# Patient Record
Sex: Female | Born: 1970 | Race: White | Hispanic: No | Marital: Married | Smoking: Current every day smoker
Health system: Southern US, Community
[De-identification: ages and names within clinical notes are randomized; demographics above are authoritative.]

## PROBLEM LIST (undated history)

## (undated) DIAGNOSIS — F419 Anxiety disorder, unspecified: Secondary | ICD-10-CM

## (undated) DIAGNOSIS — I10 Essential (primary) hypertension: Secondary | ICD-10-CM

## (undated) HISTORY — DX: Essential (primary) hypertension: I10

## (undated) HISTORY — PX: ABDOMINAL HYSTERECTOMY: SHX81

## (undated) HISTORY — DX: Anxiety disorder, unspecified: F41.9

---

## 2007-01-03 ENCOUNTER — Ambulatory Visit: Payer: Self-pay | Admitting: Internal Medicine

## 2007-10-21 ENCOUNTER — Emergency Department: Payer: Self-pay | Admitting: Emergency Medicine

## 2008-03-03 ENCOUNTER — Ambulatory Visit: Payer: Self-pay | Admitting: Unknown Physician Specialty

## 2008-03-06 ENCOUNTER — Ambulatory Visit: Payer: Self-pay | Admitting: Unknown Physician Specialty

## 2008-11-03 ENCOUNTER — Emergency Department: Payer: Self-pay | Admitting: Emergency Medicine

## 2009-02-09 ENCOUNTER — Emergency Department: Payer: Self-pay | Admitting: Emergency Medicine

## 2010-01-18 ENCOUNTER — Inpatient Hospital Stay: Payer: Self-pay | Admitting: Unknown Physician Specialty

## 2010-01-22 ENCOUNTER — Ambulatory Visit: Payer: Self-pay | Admitting: Unknown Physician Specialty

## 2010-02-21 ENCOUNTER — Ambulatory Visit: Payer: Self-pay | Admitting: Unknown Physician Specialty

## 2010-03-24 ENCOUNTER — Ambulatory Visit: Payer: Self-pay | Admitting: Unknown Physician Specialty

## 2011-09-20 ENCOUNTER — Ambulatory Visit: Payer: Self-pay | Admitting: Internal Medicine

## 2012-05-11 ENCOUNTER — Ambulatory Visit: Payer: Self-pay | Admitting: Internal Medicine

## 2012-05-22 ENCOUNTER — Ambulatory Visit: Payer: Self-pay | Admitting: Internal Medicine

## 2013-04-04 ENCOUNTER — Ambulatory Visit: Payer: Self-pay | Admitting: Orthopedic Surgery

## 2013-07-18 ENCOUNTER — Ambulatory Visit: Payer: Self-pay | Admitting: Internal Medicine

## 2013-07-23 ENCOUNTER — Ambulatory Visit: Payer: Self-pay | Admitting: Internal Medicine

## 2013-12-04 ENCOUNTER — Ambulatory Visit: Payer: Self-pay | Admitting: Internal Medicine

## 2013-12-12 ENCOUNTER — Ambulatory Visit: Payer: Self-pay | Admitting: Anesthesiology

## 2014-04-16 ENCOUNTER — Other Ambulatory Visit: Payer: Self-pay | Admitting: Diagnostic Radiology

## 2014-04-16 LAB — CREATININE, SERUM
CREATININE: 0.38 mg/dL — AB (ref 0.60–1.30)
EGFR (Non-African Amer.): >60

## 2014-04-17 ENCOUNTER — Ambulatory Visit: Payer: Self-pay | Admitting: Internal Medicine

## 2014-05-22 ENCOUNTER — Ambulatory Visit: Payer: Self-pay | Admitting: Vascular Surgery

## 2015-07-16 ENCOUNTER — Other Ambulatory Visit: Payer: Self-pay | Admitting: Internal Medicine

## 2015-07-16 DIAGNOSIS — Z1231 Encounter for screening mammogram for malignant neoplasm of breast: Secondary | ICD-10-CM

## 2015-07-24 ENCOUNTER — Ambulatory Visit
Admission: RE | Admit: 2015-07-24 | Discharge: 2015-07-24 | Disposition: A | Discharge status: Home / Self Care | Payer: 59 | Source: Ambulatory Visit | Attending: Internal Medicine | Admitting: Internal Medicine

## 2015-07-24 DIAGNOSIS — Z1231 Encounter for screening mammogram for malignant neoplasm of breast: Secondary | ICD-10-CM

## 2015-12-16 ENCOUNTER — Ambulatory Visit: Payer: 59 | Admitting: Anesthesiology

## 2015-12-31 ENCOUNTER — Ambulatory Visit: Payer: 59 | Admitting: Anesthesiology

## 2016-01-05 ENCOUNTER — Ambulatory Visit: Payer: 59 | Attending: Anesthesiology | Admitting: Anesthesiology

## 2016-01-05 ENCOUNTER — Encounter: Payer: Self-pay | Admitting: Anesthesiology

## 2016-01-05 ENCOUNTER — Encounter (INDEPENDENT_AMBULATORY_CARE_PROVIDER_SITE_OTHER): Payer: Self-pay

## 2016-01-05 ENCOUNTER — Other Ambulatory Visit: Payer: Self-pay | Admitting: Internal Medicine

## 2016-01-05 VITALS — BP 154/90 | HR 82 | Temp 98.4°F | Resp 16 | Ht 62.0 in | Wt 118.0 lb

## 2016-01-05 DIAGNOSIS — M542 Cervicalgia: Secondary | ICD-10-CM | POA: Diagnosis present

## 2016-01-05 DIAGNOSIS — M5030 Other cervical disc degeneration, unspecified cervical region: Secondary | ICD-10-CM | POA: Diagnosis not present

## 2016-01-05 DIAGNOSIS — Z79899 Other long term (current) drug therapy: Secondary | ICD-10-CM | POA: Insufficient documentation

## 2016-01-05 DIAGNOSIS — F419 Anxiety disorder, unspecified: Secondary | ICD-10-CM | POA: Insufficient documentation

## 2016-01-05 DIAGNOSIS — Z90710 Acquired absence of both cervix and uterus: Secondary | ICD-10-CM | POA: Insufficient documentation

## 2016-01-05 DIAGNOSIS — M47812 Spondylosis without myelopathy or radiculopathy, cervical region: Secondary | ICD-10-CM

## 2016-01-05 DIAGNOSIS — I10 Essential (primary) hypertension: Secondary | ICD-10-CM | POA: Insufficient documentation

## 2016-01-05 DIAGNOSIS — M1388 Other specified arthritis, other site: Secondary | ICD-10-CM | POA: Diagnosis not present

## 2016-01-05 DIAGNOSIS — M545 Low back pain: Secondary | ICD-10-CM | POA: Insufficient documentation

## 2016-01-05 DIAGNOSIS — G8929 Other chronic pain: Secondary | ICD-10-CM

## 2016-01-05 DIAGNOSIS — F17210 Nicotine dependence, cigarettes, uncomplicated: Secondary | ICD-10-CM | POA: Insufficient documentation

## 2016-01-05 DIAGNOSIS — M5412 Radiculopathy, cervical region: Secondary | ICD-10-CM | POA: Diagnosis not present

## 2016-01-05 DIAGNOSIS — M4692 Unspecified inflammatory spondylopathy, cervical region: Secondary | ICD-10-CM | POA: Diagnosis not present

## 2016-01-05 NOTE — Progress Notes (Signed)
Safety precautions to be maintained throughout the outpatient stay will include: orient to surroundings, keep bed in low position, maintain call bell within reach at all times, provide assistance with transfer out of bed and ambulation.  

## 2016-01-06 ENCOUNTER — Encounter: Payer: Self-pay | Admitting: Anesthesiology

## 2016-01-06 NOTE — Progress Notes (Signed)
Subjective:  Patient ID: Holly Wilkinson, female    DOB: Jan 21, 1971  Age: 45 y.o. MRN: [id number]  CC: Neck Pain   HPI Holly Okaracy Schloss presents for a new patient evaluation. She is a pleasant 45 year old white female with a long-standing history of neck, thoracic and low back pain. She describes pain that started 3 years ago when she attempted a back flip and landed on her head. She now has neck pain that has been unrelenting for 3 years. She describes a maximum VAS score of 9 with an average of 8 gradually gotten worse. She has been seen by Dr. Daisy LazarHandi with previous neurosurgical evaluation and at that time was considered a nonsurgical candidate. She's had previous peripheral nerve conduction studies 1 year ago and has been through physical therapy with tens unit application. She was seen at the [facility]  pain clinic and received a series of cervical epidural steroids and lumbar epidural steroids without significant improvement. These were of minimal benefit and she continues to have severe neck pain with radiation into both trapezius muscles. She describes a bicep weakness right side greater than left and the pain is worse with motion and better with stretching medication management and TENS unit application. The pain is constant getting longer and associated with some tingling in her hands and neck. She has associated numbness and pain that wakes her up at night occasionally numbness radiating into both feet. She has had a previous MRI and is scheduled for another MRI and do for a repeat evaluation for neurosurgical intervention. In the past she reports that she has taken oxycodone and this is given her significant improvement in her pain and most of the conservative measures fail.  History [name] has a past medical history of Anxiety and Hypertension.   She has past surgical history that includes Abdominal hysterectomy and Cesarean section.   Her family history includes Arthritis in her mother; COPD in her  maternal uncle; Cancer in her mother; Diabetes in her mother; Hypertension in her mother.She reports that she has been smoking Cigarettes.  She has been smoking about 0.50 packs per day. She does not have any smokeless tobacco history on file. She reports that she does not drink alcohol or use illicit drugs.   ---------------------------------------------------------------------------------------------------------------------- Past Medical History  Diagnosis Date  . Anxiety   . Hypertension     Past Surgical History  Procedure Laterality Date  . Abdominal hysterectomy    . Cesarean section      Family History  Problem Relation Age of Onset  . Arthritis Mother   . Cancer Mother   . Diabetes Mother   . Hypertension Mother   . COPD Maternal Uncle     Social History  Substance Use Topics  . Smoking status: Current Every Day Smoker -- 0.50 packs/day    Types: Cigarettes  . Smokeless tobacco: Not on file  . Alcohol Use: No    ---------------------------------------------------------------------------------------------------------------------- Social History   Social History  . Marital Status: Divorced    Spouse Name: N/A  . Number of Children: N/A  . Years of Education: N/A   Social History Main Topics  . Smoking status: Current Every Day Smoker -- 0.50 packs/day    Types: Cigarettes  . Smokeless tobacco: None  . Alcohol Use: No  . Drug Use: No  . Sexual Activity: Not Asked   Other Topics Concern  . None   Social History Narrative      ----------------------------------------------------------------------------------------------------------------------  ROS Review of Systems  Cardiac: High  blood pressure Pulmonary: Smoker Neurologic: Anxiety Psychologic: Negative other than anxiety GI: Negative  Objective:  BP 154/90 mmHg  Pulse 82  Temp(Src) 98.4 F (36.9 C) (Oral)  Resp 16  Ht  (1.575 m)  Wt 118 lb (53.524 kg)  BMI 21.58 kg/m2  SpO2  99%  Physical Exam  Patient is a alert oriented and cooperative Good historian Extraocular muscles intact Heart is regular rate and rhythm Lungs are clear to also dictation Inspection of the neck reveals limited range of motion at the atlantooccipital joint. She has tenderness in the bilateral trapezius muscles and slightly weak with flexion of the right biceps as compared to left Grip strength is intact bilaterally but appears slightly diminished right versus left sensation is grossly intact    Assessment & Plan:   Calea was seen today for neck pain.  Diagnoses and all orders for this visit:  Cervicalgia -     ToxASSURE Select 13 (MW), Urine  DDD (degenerative disc disease), cervical  Facet arthritis of cervical region [facility])  Cervical radiculitis     ----------------------------------------------------------------------------------------------------------------------  Problem List Items Addressed This Visit    None    Visit Diagnoses    Cervicalgia    -  Primary    Relevant Orders    ToxASSURE Select 13 (MW), Urine    DDD (degenerative disc disease), cervical        Relevant Medications    oxyCODONE-acetaminophen (PERCOCET) 10-325 MG tablet    Facet arthritis of cervical region (HCC)        Relevant Medications    oxyCODONE-acetaminophen (PERCOCET) 10-325 MG tablet    Cervical radiculitis        Relevant Medications    clonazePAM (KLONOPIN) 1 MG tablet    escitalopram (LEXAPRO) 10 MG tablet       ----------------------------------------------------------------------------------------------------------------------  1. Cervicalgia  - ToxASSURE Select 13 (MW), Urine  2. DDD (degenerative disc disease), cervical We will withhold any further invasive procedures such as cervical epidural steroid administration and she is not responding favorably to these in the past  3. Facet arthritis of cervical region [facility]) She could be a candidate for cervical facet  injections  4. Cervical radiculitis She is due for repeat MRI and is scheduled for a repeat neurosurgical evaluation. We will have her return to clinic in 2-3 weeks for reevaluation and possible initiation of opioid management as discussed with her today and contingent upon maintenance of the clinic policy    ----------------------------------------------------------------------------------------------------------------------  I am having Ms. Insco maintain her amLODipine, clonazePAM, escitalopram, losartan, and oxyCODONE-acetaminophen.   Meds ordered this encounter  Medications  . amLODipine (NORVASC) 2.5 MG tablet    Sig: Take by mouth.  . clonazePAM (KLONOPIN) 1 MG tablet    Sig:   . escitalopram (LEXAPRO) 10 MG tablet    Sig: Take by mouth.  . losartan (COZAAR) 25 MG tablet    Sig: Take by mouth.  . oxyCODONE-acetaminophen (PERCOCET) 10-325 MG tablet    Sig: Take by mouth.       Follow-up: Return in about 2 weeks (around 01/19/2016) for evaluation, med refill.    Yevette Edwards, MD

## 2016-01-08 ENCOUNTER — Ambulatory Visit
Admission: RE | Admit: 2016-01-08 | Discharge: 2016-01-08 | Disposition: A | Discharge status: Home / Self Care | Payer: Commercial Managed Care - HMO | Source: Ambulatory Visit | Attending: Internal Medicine | Admitting: Internal Medicine

## 2016-01-08 DIAGNOSIS — M50221 Other cervical disc displacement at C4-C5 level: Secondary | ICD-10-CM | POA: Diagnosis not present

## 2016-01-08 DIAGNOSIS — M50222 Other cervical disc displacement at C5-C6 level: Secondary | ICD-10-CM | POA: Diagnosis not present

## 2016-01-08 DIAGNOSIS — M542 Cervicalgia: Secondary | ICD-10-CM | POA: Insufficient documentation

## 2016-01-08 DIAGNOSIS — G8929 Other chronic pain: Secondary | ICD-10-CM | POA: Insufficient documentation

## 2016-01-09 LAB — TOXASSURE SELECT 13 (MW), URINE: PDF: 0

## 2016-01-26 ENCOUNTER — Ambulatory Visit: Payer: 59 | Attending: Anesthesiology | Admitting: Anesthesiology

## 2016-01-26 ENCOUNTER — Encounter: Payer: Self-pay | Admitting: Anesthesiology

## 2016-01-26 VITALS — BP 147/87 | HR 100 | Temp 97.1°F | Resp 16 | Ht 62.0 in | Wt 118.0 lb

## 2016-01-26 DIAGNOSIS — M542 Cervicalgia: Secondary | ICD-10-CM | POA: Diagnosis not present

## 2016-01-26 DIAGNOSIS — M5412 Radiculopathy, cervical region: Secondary | ICD-10-CM

## 2016-01-26 DIAGNOSIS — M4692 Unspecified inflammatory spondylopathy, cervical region: Secondary | ICD-10-CM | POA: Diagnosis not present

## 2016-01-26 DIAGNOSIS — M5030 Other cervical disc degeneration, unspecified cervical region: Secondary | ICD-10-CM | POA: Diagnosis not present

## 2016-01-26 DIAGNOSIS — M47812 Spondylosis without myelopathy or radiculopathy, cervical region: Secondary | ICD-10-CM

## 2016-01-26 DIAGNOSIS — M5010 Cervical disc disorder with radiculopathy, unspecified cervical region: Secondary | ICD-10-CM | POA: Diagnosis not present

## 2016-01-26 MED ORDER — OXYCODONE-ACETAMINOPHEN 10-325 MG PO TABS: 1.0000 | ORAL_TABLET | Freq: Three times a day (TID) | ORAL | Status: DC | PRN

## 2016-01-26 NOTE — Progress Notes (Signed)
Safety precautions to be maintained throughout the outpatient stay will include: orient to surroundings, keep bed in low position, maintain call bell within reach at all times, provide assistance with transfer out of bed and ambulation.  

## 2016-02-22 ENCOUNTER — Encounter: Payer: Self-pay | Admitting: Anesthesiology

## 2016-02-22 ENCOUNTER — Ambulatory Visit: Payer: 59 | Attending: Anesthesiology | Admitting: Anesthesiology

## 2016-02-22 VITALS — BP 130/84 | HR 79 | Temp 98.3°F | Resp 16 | Ht 62.0 in | Wt 120.0 lb

## 2016-02-22 DIAGNOSIS — M4692 Unspecified inflammatory spondylopathy, cervical region: Secondary | ICD-10-CM

## 2016-02-22 DIAGNOSIS — M5412 Radiculopathy, cervical region: Secondary | ICD-10-CM

## 2016-02-22 DIAGNOSIS — R200 Anesthesia of skin: Secondary | ICD-10-CM | POA: Insufficient documentation

## 2016-02-22 DIAGNOSIS — F17210 Nicotine dependence, cigarettes, uncomplicated: Secondary | ICD-10-CM | POA: Insufficient documentation

## 2016-02-22 DIAGNOSIS — F419 Anxiety disorder, unspecified: Secondary | ICD-10-CM | POA: Insufficient documentation

## 2016-02-22 DIAGNOSIS — M545 Low back pain: Secondary | ICD-10-CM | POA: Insufficient documentation

## 2016-02-22 DIAGNOSIS — M542 Cervicalgia: Secondary | ICD-10-CM | POA: Insufficient documentation

## 2016-02-22 DIAGNOSIS — I10 Essential (primary) hypertension: Secondary | ICD-10-CM | POA: Insufficient documentation

## 2016-02-22 DIAGNOSIS — M47812 Spondylosis without myelopathy or radiculopathy, cervical region: Secondary | ICD-10-CM

## 2016-02-22 DIAGNOSIS — M5010 Cervical disc disorder with radiculopathy, unspecified cervical region: Secondary | ICD-10-CM | POA: Insufficient documentation

## 2016-02-22 DIAGNOSIS — Z90710 Acquired absence of both cervix and uterus: Secondary | ICD-10-CM | POA: Diagnosis not present

## 2016-02-22 DIAGNOSIS — M5030 Other cervical disc degeneration, unspecified cervical region: Secondary | ICD-10-CM | POA: Diagnosis not present

## 2016-02-22 MED ORDER — OXYCODONE-ACETAMINOPHEN 10-325 MG PO TABS: 1.0000 | ORAL_TABLET | Freq: Three times a day (TID) | ORAL | Status: DC | PRN

## 2016-02-22 NOTE — Progress Notes (Signed)
Patient here for medication refill.  Patient has a scheduled surgery to replace 1 cervical disc on 02/23/16. Safety precautions to be maintained throughout the outpatient stay will include: orient to surroundings, keep bed in low position, maintain call bell within reach at all times, provide assistance with transfer out of bed and ambulation.

## 2016-02-23 HISTORY — PX: NECK SURGERY: SHX720

## 2016-02-23 NOTE — Progress Notes (Signed)
Subjective:  Patient ID: Holly Wilkinson, female    DOB: 1971-04-13  Age: 45 y.o. MRN: [id number]  CC: Neck Pain   HPI Holly Wilkinson presents forReevaluation. She is a pleasant 45 year old white female with a long-standing history of neck, thoracic and low back pain. She describes pain that started 3 years ago when she attempted a back flip and landed on her head. She now has neck pain that has been unrelenting for 3 years. She describes a maximum VAS score of 9 with an average of 8 gradually gotten worse. She has been seen by Dr. Daisy LazarHandi with previous neurosurgical evaluation and at that time was considered a nonsurgical candidate. She's had previous peripheral nerve conduction studies 1 year ago and has been through physical therapy with tens unit application. She was seen at the [facility]  pain clinic and received a series of cervical epidural steroids and lumbar epidural steroids without significant improvement. These were of minimal benefit and she continues to have severe neck pain with radiation into both trapezius muscles. She describes a bicep weakness right side greater than left and the pain is worse with motion and better with stretching medication management and TENS unit application. The pain is constant getting longer and associated with some tingling in her hands and neck. She has associated numbness and pain that wakes her up at night occasionally numbness radiating into both feet. She has had a previous MRI and is scheduled for another MRI and do for a repeat evaluation for neurosurgical intervention. In the past she reports that she has taken oxycodone and this is given her significant improvement in her pain and most of the conservative measures fail.  She presents today stating that she is due for a surgical intervention with Dr. Danielle DessElsner tomorrow. He is planning on a 2 level cervical fusion.  History Holly Wilkinson has a past medical history of Anxiety and Hypertension.   She has past surgical  history that includes Abdominal hysterectomy and Cesarean section.   Her family history includes Arthritis in her mother; COPD in her maternal uncle; Cancer in her mother; Diabetes in her mother; Hypertension in her mother.She reports that she has been smoking Cigarettes.  She has been smoking about 0.50 packs per day. She does not have any smokeless tobacco history on file. She reports that she does not drink alcohol or use illicit drugs.   ---------------------------------------------------------------------------------------------------------------------- Past Medical History  Diagnosis Date  . Anxiety   . Hypertension     Past Surgical History  Procedure Laterality Date  . Abdominal hysterectomy    . Cesarean section      Family History  Problem Relation Age of Onset  . Arthritis Mother   . Cancer Mother   . Diabetes Mother   . Hypertension Mother   . COPD Maternal Uncle     Social History  Substance Use Topics  . Smoking status: Current Every Day Smoker -- 0.50 packs/day    Types: Cigarettes  . Smokeless tobacco: Not on file  . Alcohol Use: No    ---------------------------------------------------------------------------------------------------------------------- Social History   Social History  . Marital Status: Married    Spouse Name: N/A  . Number of Children: N/A  . Years of Education: N/A   Social History Main Topics  . Smoking status: Current Every Day Smoker -- 0.50 packs/day    Types: Cigarettes  . Smokeless tobacco: None  . Alcohol Use: No  . Drug Use: No  . Sexual Activity: Not Asked   Other Topics Concern  .  None   Social History Narrative      ----------------------------------------------------------------------------------------------------------------------  ROS Review of Systems  Cardiac: High blood pressure Pulmonary: Smoker Neurologic: Anxiety Psychologic: Negative other than anxiety GI: Negative  Objective:  BP 130/84 mmHg   Pulse 79  Temp(Src) 98.3 F (36.8 C) (Oral)  Resp 16  Ht  (1.575 m)  Wt 120 lb (54.432 kg)  BMI 21.94 kg/m2  SpO2 98%  Physical Exam  Patient is a alert oriented and cooperative Good historian Extraocular muscles intact Heart is regular rate and rhythm Lungs are clear to also dictation Inspection of the neck reveals limited range of motion at the atlantooccipital joint. She has tenderness in the bilateral trapezius muscles and slightly weak with flexion of the right biceps as compared to left Grip strength is intact bilaterally but appears slightly diminished right versus left sensation is grossly intact    Assessment & Plan:   Holly Wilkinson was seen today for neck pain.  Diagnoses and all orders for this visit:  DDD (degenerative disc disease), cervical  Facet arthritis of cervical region [facility])  Cervical radiculitis  Other orders -     Discontinue: oxyCODONE-acetaminophen (PERCOCET) 10-325 MG tablet; Take 1 tablet by mouth every 8 (eight) hours as needed for pain. -     oxyCODONE-acetaminophen (PERCOCET) 10-325 MG tablet; Take 1 tablet by mouth every 8 (eight) hours as needed for pain.     ----------------------------------------------------------------------------------------------------------------------  Problem List Items Addressed This Visit    None    Visit Diagnoses    DDD (degenerative disc disease), cervical    -  Primary    Relevant Medications    oxyCODONE-acetaminophen (PERCOCET) 10-325 MG tablet    Facet arthritis of cervical region [facility])        Relevant Medications    oxyCODONE-acetaminophen (PERCOCET) 10-325 MG tablet    Cervical radiculitis           ----------------------------------------------------------------------------------------------------------------------  1. Cervicalgia  - ToxASSURE Select 13 (MW), UrineAnd appropriate  2. DDD (degenerative disc disease), cervical We will withhold any further invasive procedures such as  cervical epidural steroid administration and she is not responding favorably to these in the past with her plan on cervical fusion tomorrow with Dr. Danielle Dess  3. Facet arthritis of cervical region [facility]) She could be a candidate for cervical facet injections  4. Cervical radiculitis She does return to clinic in 1 month for refill of her medications and her initial fill today.   ----------------------------------------------------------------------------------------------------------------------  I am having Holly Wilkinson maintain her amLODipine, clonazePAM, escitalopram, losartan, albuterol, amLODipine, and oxyCODONE-acetaminophen.   Meds ordered this encounter  Medications  . DISCONTD: oxyCODONE-acetaminophen (PERCOCET) 10-325 MG tablet    Sig: Take 1 tablet by mouth every 8 (eight) hours as needed for pain.    Dispense:  90 tablet    Refill:  0    Do not fill until [id number]  . oxyCODONE-acetaminophen (PERCOCET) 10-325 MG tablet    Sig: Take 1 tablet by mouth every 8 (eight) hours as needed for pain.    Dispense:  90 tablet    Refill:  0       Follow-up: Return in about 1 month (around 03/24/2016) for evaluation, med refill.    Yevette Edwards, MD

## 2016-03-25 ENCOUNTER — Ambulatory Visit: Payer: 59 | Attending: Anesthesiology | Admitting: Anesthesiology

## 2016-03-25 ENCOUNTER — Encounter: Payer: Self-pay | Admitting: Anesthesiology

## 2016-03-25 VITALS — BP 151/94 | HR 106 | Temp 98.3°F | Resp 16 | Ht 62.0 in | Wt 119.0 lb

## 2016-03-25 DIAGNOSIS — M5010 Cervical disc disorder with radiculopathy, unspecified cervical region: Secondary | ICD-10-CM | POA: Insufficient documentation

## 2016-03-25 DIAGNOSIS — M5030 Other cervical disc degeneration, unspecified cervical region: Secondary | ICD-10-CM | POA: Diagnosis not present

## 2016-03-25 DIAGNOSIS — M4692 Unspecified inflammatory spondylopathy, cervical region: Secondary | ICD-10-CM

## 2016-03-25 DIAGNOSIS — I10 Essential (primary) hypertension: Secondary | ICD-10-CM | POA: Insufficient documentation

## 2016-03-25 DIAGNOSIS — M542 Cervicalgia: Secondary | ICD-10-CM | POA: Insufficient documentation

## 2016-03-25 DIAGNOSIS — M47812 Spondylosis without myelopathy or radiculopathy, cervical region: Secondary | ICD-10-CM

## 2016-03-25 DIAGNOSIS — F419 Anxiety disorder, unspecified: Secondary | ICD-10-CM | POA: Diagnosis not present

## 2016-03-25 DIAGNOSIS — M5412 Radiculopathy, cervical region: Secondary | ICD-10-CM | POA: Diagnosis not present

## 2016-03-25 DIAGNOSIS — F17210 Nicotine dependence, cigarettes, uncomplicated: Secondary | ICD-10-CM | POA: Insufficient documentation

## 2016-03-25 DIAGNOSIS — Z90710 Acquired absence of both cervix and uterus: Secondary | ICD-10-CM | POA: Insufficient documentation

## 2016-03-25 MED ORDER — METHOCARBAMOL 500 MG PO TABS: 500.0000 mg | ORAL_TABLET | Freq: Four times a day (QID) | ORAL | Status: DC | PRN

## 2016-03-25 MED ORDER — OXYCODONE-ACETAMINOPHEN 10-325 MG PO TABS: 1.0000 | ORAL_TABLET | Freq: Three times a day (TID) | ORAL | Status: DC | PRN

## 2016-03-25 NOTE — Progress Notes (Signed)
Patient here for medication management.  Is s/p neck surgery having had 2 cervical disc replaced.  Patient was taking some medicines that were prescribed from surgeon and found that the Robaxin was helping her. Have included in her medication list. Safety precautions to be maintained throughout the outpatient stay will include: orient to surroundings, keep bed in low position, maintain call bell within reach at all times, provide assistance with transfer out of bed and ambulation.

## 2016-03-25 NOTE — Progress Notes (Signed)
Subjective:  Patient ID: Holly Wilkinson, female    DOB: 1971/01/13  Age: 45 y.o. MRN: [id number]  CC: Neck Pain  No procedure   HPI Holly Wilkinson presents for reevaluation today. She was last seen 1 month ago. She is now approximately one month following a 2 level cervical fusion and the radicular pain she was experiencing has resolved. Her strength in the upper extremities is doing well. She is having some bilateral paraspinous cervical pain and upper shoulder pain periodically. She is taking her medications as prescribed and doing well with these. Otherwise based on her narcotic assessment sheet she's doing well with the regimen and getting good relief with her medication management.  History Holly Wilkinson has a past medical history of Anxiety and Hypertension.   She has past surgical history that includes Abdominal hysterectomy; Cesarean section; and Neck surgery (Right, 02/23/16).   Her family history includes Arthritis in her mother; COPD in her maternal uncle; Cancer in her mother; Diabetes in her mother; Hypertension in her mother.She reports that she has been smoking Cigarettes.  She has been smoking about 0.50 packs per day. She does not have any smokeless tobacco history on file. She reports that she does not drink alcohol or use illicit drugs.   ---------------------------------------------------------------------------------------------------------------------- Past Medical History  Diagnosis Date  . Anxiety   . Hypertension     Past Surgical History  Procedure Laterality Date  . Abdominal hysterectomy    . Cesarean section    . Neck surgery Right 02/23/16    2 disc replaced.     Family History  Problem Relation Age of Onset  . Arthritis Mother   . Cancer Mother   . Diabetes Mother   . Hypertension Mother   . COPD Maternal Uncle     Social History  Substance Use Topics  . Smoking status: Current Every Day Smoker -- 0.50 packs/day    Types: Cigarettes  . Smokeless  tobacco: Not on file  . Alcohol Use: No    ---------------------------------------------------------------------------------------------------------------------- Social History   Social History  . Marital Status: Married    Spouse Name: N/A  . Number of Children: N/A  . Years of Education: N/A   Social History Main Topics  . Smoking status: Current Every Day Smoker -- 0.50 packs/day    Types: Cigarettes  . Smokeless tobacco: None  . Alcohol Use: No  . Drug Use: No  . Sexual Activity: Not Asked   Other Topics Concern  . None   Social History Narrative      ----------------------------------------------------------------------------------------------------------------------  ROS Review of Systems  Cardiac: High blood pressure Pulmonary: Smoker Neurologic: Anxiety Psychologic: Negative other than anxiety GI: Negative  Objective:  BP 151/94 mmHg  Pulse 106  Temp(Src) 98.3 F (36.8 C) (Oral)  Resp 16  Ht 5\' 2"  (1.575 m)  Wt 119 lb (53.978 kg)  BMI 21.76 kg/m2  SpO2 100%  Physical Exam  Patient is a alert oriented and cooperative Good historian Extraocular muscles intact Heart is regular rate and rhythm Lungs are clear to also dictation Inspection of the neck reveals mildly limited range of motion but good grip strength and bilateral upper flexion and extension appears to be appropriate the scar is well healed over the cervical region  Assessment & Plan:   Holly Wilkinson was seen today for neck pain.  Diagnoses and all orders for this visit:  Facet arthritis of cervical region [facility](HCC)  Cervical radiculitis  Cervicalgia  DDD (degenerative disc disease), cervical  Other orders -  oxyCODONE-acetaminophen (PERCOCET) 10-325 MG tablet; Take 1 tablet by mouth every 8 (eight) hours as needed for pain. -     methocarbamol (ROBAXIN) 500 MG tablet; Take 1 tablet (500 mg total) by mouth every 6 (six) hours as needed for muscle  spasms.     ----------------------------------------------------------------------------------------------------------------------  Problem List Items Addressed This Visit    None    Visit Diagnoses    Facet arthritis of cervical region [facility])    -  Primary    Relevant Medications    oxyCODONE-acetaminophen (PERCOCET) 10-325 MG tablet    methocarbamol (ROBAXIN) 500 MG tablet    Cervical radiculitis        Relevant Medications    methocarbamol (ROBAXIN) 500 MG tablet    Cervicalgia        DDD (degenerative disc disease), cervical        Relevant Medications    oxyCODONE-acetaminophen (PERCOCET) 10-325 MG tablet    methocarbamol (ROBAXIN) 500 MG tablet       ----------------------------------------------------------------------------------------------------------------------  1. Cervicalgia  - ToxASSURE Select 13 (MW), UrineAnd appropriate  2. DDD (degenerative disc disease), cervical We will withhold any further invasive procedures such as cervical epidural steroid administration and she is not responding favorably to these in the past with her plan on cervical fusion tomorrow with Dr. Danielle Dess  3. Facet arthritis of cervical region [facility]) She could be a candidate for cervical facet injections  4. Cervical radiculitis continue doing stretching strengthening exercises as per physical therapy and as prescribed by her neurosurgeon. Return to clinic in 1 month and continue current medication regimen   ----------------------------------------------------------------------------------------------------------------------  I have changed Holly Wilkinson's methocarbamol. I am also having her maintain her amLODipine, clonazePAM, escitalopram, losartan, albuterol, amLODipine, promethazine, and oxyCODONE-acetaminophen.   Meds ordered this encounter  Medications  . promethazine (PHENERGAN) 12.5 MG tablet    Sig: Take 12.5 mg by mouth every 6 (six) hours as needed for nausea or vomiting.  Marland Kitchen  DISCONTD: methocarbamol (ROBAXIN) 500 MG tablet    Sig: Take 500 mg by mouth every 6 (six) hours as needed for muscle spasms.  Marland Kitchen oxyCODONE-acetaminophen (PERCOCET) 10-325 MG tablet    Sig: Take 1 tablet by mouth every 8 (eight) hours as needed for pain.    Dispense:  90 tablet    Refill:  0  . methocarbamol (ROBAXIN) 500 MG tablet    Sig: Take 1 tablet (500 mg total) by mouth every 6 (six) hours as needed for muscle spasms.    Dispense:  120 tablet    Refill:  2       Follow-up: Return in about 1 month (around 04/24/2016) for evaluation, med refill.    Yevette Edwards, MD

## 2016-04-21 ENCOUNTER — Encounter: Payer: Self-pay | Admitting: Anesthesiology

## 2016-04-21 ENCOUNTER — Ambulatory Visit: Payer: 59 | Attending: Anesthesiology | Admitting: Anesthesiology

## 2016-04-21 VITALS — BP 123/90 | HR 104 | Resp 16 | Ht 62.0 in | Wt 120.0 lb

## 2016-04-21 DIAGNOSIS — F419 Anxiety disorder, unspecified: Secondary | ICD-10-CM | POA: Insufficient documentation

## 2016-04-21 DIAGNOSIS — M549 Dorsalgia, unspecified: Secondary | ICD-10-CM | POA: Diagnosis present

## 2016-04-21 DIAGNOSIS — I10 Essential (primary) hypertension: Secondary | ICD-10-CM | POA: Diagnosis not present

## 2016-04-21 DIAGNOSIS — M5412 Radiculopathy, cervical region: Secondary | ICD-10-CM

## 2016-04-21 DIAGNOSIS — M25519 Pain in unspecified shoulder: Secondary | ICD-10-CM | POA: Diagnosis present

## 2016-04-21 DIAGNOSIS — F17210 Nicotine dependence, cigarettes, uncomplicated: Secondary | ICD-10-CM | POA: Diagnosis not present

## 2016-04-21 DIAGNOSIS — M797 Fibromyalgia: Secondary | ICD-10-CM

## 2016-04-21 DIAGNOSIS — M7918 Myalgia, other site: Secondary | ICD-10-CM

## 2016-04-21 DIAGNOSIS — M542 Cervicalgia: Secondary | ICD-10-CM | POA: Insufficient documentation

## 2016-04-21 DIAGNOSIS — M5010 Cervical disc disorder with radiculopathy, unspecified cervical region: Secondary | ICD-10-CM | POA: Insufficient documentation

## 2016-04-21 MED ORDER — OXYCODONE-ACETAMINOPHEN 5-325 MG PO TABS: 1.0000 | ORAL_TABLET | Freq: Four times a day (QID) | ORAL | Status: DC | PRN

## 2016-04-21 NOTE — Progress Notes (Signed)
Subjective:  Patient ID: Holly Wilkinson, female    DOB: 1971/09/19  Age: 45 y.o. MRN: [id number]  CC: Shoulder Pain and Back Pain  No procedure   HPI Holly Okaracy Ruffins presents for reevaluation today.She was last seen in the beginning of June and continues to do well in regards to her recent ACD at 2 levels. She is taking her medications including Percocet 10 mg 3 times a day and this is well tolerated. She is having some burning and spasming in the right proximal trapezius region but her upper extremity strength and function has been well preserved. Furthermore she is having occasional low back pain centralized in the low back without radiation to the lower extremities. Generally stretching alleviates this pain and keeps it under reasonable control. No issues with bowel or bladder function are mentioned today.  History [name] has a past medical history of Anxiety and Hypertension.   She has past surgical history that includes Abdominal hysterectomy; Cesarean section; and Neck surgery (Right, 02/23/16).   Her family history includes Arthritis in her mother; COPD in her maternal uncle; Cancer in her mother; Diabetes in her mother; Hypertension in her mother.She reports that she has been smoking Cigarettes.  She has been smoking about 0.50 packs per day. She does not have any smokeless tobacco history on file. She reports that she does not drink alcohol or use illicit drugs.   ---------------------------------------------------------------------------------------------------------------------- Past Medical History  Diagnosis Date  . Anxiety   . Hypertension     Past Surgical History  Procedure Laterality Date  . Abdominal hysterectomy    . Cesarean section    . Neck surgery Right 02/23/16    2 disc replaced.     Family History  Problem Relation Age of Onset  . Arthritis Mother   . Cancer Mother   . Diabetes Mother   . Hypertension Mother   . COPD Maternal Uncle     Social History   Substance Use Topics  . Smoking status: Current Every Day Smoker -- 0.50 packs/day    Types: Cigarettes  . Smokeless tobacco: Not on file  . Alcohol Use: No    ---------------------------------------------------------------------------------------------------------------------- Social History   Social History  . Marital Status: Married    Spouse Name: N/A  . Number of Children: N/A  . Years of Education: N/A   Social History Main Topics  . Smoking status: Current Every Day Smoker -- 0.50 packs/day    Types: Cigarettes  . Smokeless tobacco: None  . Alcohol Use: No  . Drug Use: No  . Sexual Activity: Not Asked   Other Topics Concern  . None   Social History Narrative      ----------------------------------------------------------------------------------------------------------------------  ROS Review of Systems  Cardiac: High blood pressure Pulmonary: Smoker Neurologic: Anxiety Psychologic: Negative other than anxiety GI: Negative  Objective:  BP 123/90 mmHg  Pulse 104  Resp 16  Ht 5\' 2"  (1.575 m)  Wt 120 lb (54.432 kg)  BMI 21.94 kg/m2  SpO2 100%  Physical Exam  Patient is a alert oriented and cooperative Good historian Extraocular muscles intact Heart is regular rate and rhythm Lungs are clear to also dictation There is a well-healed trigger in the proximal right trapezius  Assessment & Plan:   [name] was seen today for shoulder pain and back pain.  Diagnoses and all orders for this visit:  Cervical radiculitis  Cervicalgia -     TRIGGER POINT INJECTION; Future  Myofacial muscle pain -     TRIGGER POINT INJECTION;  Future  Other orders -     oxyCODONE-acetaminophen (ROXICET) 5-325 MG tablet; Take 1 tablet by mouth every 6 (six) hours as needed for severe pain.     ----------------------------------------------------------------------------------------------------------------------  Problem List Items Addressed This Visit    None     Visit Diagnoses    Cervical radiculitis    -  Primary    Cervicalgia        Relevant Orders    TRIGGER POINT INJECTION    Myofacial muscle pain        Relevant Orders    TRIGGER POINT INJECTION       ----------------------------------------------------------------------------------------------------------------------  1. Cervicalgia  2. DDD (degenerative disc disease), cervical Continue follow-up with Dr. Danielle DessElsner 3. Facet arthritis of cervical region (HCC)  3. Cervical radiculitis continue doing stretching strengthening exercises as per physical therapy and as prescribed by her neurosurgeon. Return to clinic in 1 month and continue current medication regimen. We will wean her medications as noted with her to the 5 mg 4 times a day dosing with an effort to discontinue within the next few months. Continue with physical therapy exercises with trigger point injection at her next visit.   ----------------------------------------------------------------------------------------------------------------------  I have discontinued Ms. Musolf's oxyCODONE-acetaminophen. I am also having her start on oxyCODONE-acetaminophen. Additionally, I am having her maintain her amLODipine, clonazePAM, escitalopram, losartan, albuterol, amLODipine, promethazine, and methocarbamol.   Meds ordered this encounter  Medications  . oxyCODONE-acetaminophen (ROXICET) 5-325 MG tablet    Sig: Take 1 tablet by mouth every 6 (six) hours as needed for severe pain.    Dispense:  120 tablet    Refill:  0       Follow-up: Return in about 1 month (around 05/21/2016).    Yevette EdwardsJames G Lydie Stammen, MD

## 2016-04-21 NOTE — Patient Instructions (Signed)
Trigger Point Injection Trigger points are areas where you have muscle pain. A trigger point injection is a shot given in the trigger point to relieve that pain. A trigger point might feel like a knot in your muscle. It hurts to press on a trigger point. Sometimes the pain spreads out (radiates) to other parts of the body. For example, pressing on a trigger point in your shoulder might cause pain in your arm or neck. You might have one trigger point. Or, you might have more than one. People often have trigger points in their upper back and lower back. They also occur often in the neck and shoulders. Pain from a trigger point lasts for a long time. It can make it hard to keep moving. You might not be able to do the exercise or physical therapy that could help you deal with the pain. A trigger point injection may help. It does not work for everyone. But, it may relieve your pain for a few days or a few months. A trigger point injection does not cure long-lasting (chronic) pain. LET YOUR CAREGIVER KNOW ABOUT:  Any allergies (especially to latex, lidocaine, or steroids).  Blood-thinning medicines that you take. These drugs can lead to bleeding or bruising after an injection. They include:  Aspirin.  Ibuprofen.  Clopidogrel.  Warfarin.  Other medicines you take. This includes all vitamins, herbs, eyedrops, over-the-counter medicines, and creams.  Use of steroids.  Recent infections.  Past problems with numbing medicines.  Bleeding problems.  Surgeries you have had.  Other health problems. RISKS AND COMPLICATIONS A trigger point injection is a safe treatment. However, problems may develop, such as:  Minor side effects usually go away in 1 to 2 days. These may include:  Soreness.  Bruising.  Stiffness.  More serious problems are rare. But, they may include:  Bleeding under the skin (hematoma).  Skin infection.  Breaking off of the needle under your skin.  Lung  puncture.  The trigger point injection may not work for you. BEFORE THE PROCEDURE You may need to stop taking any medicine that thins your blood. This is to prevent bleeding and bruising. Usually these medicines are stopped several days before the injection. No other preparation is needed. PROCEDURE  A trigger point injection can be given in your caregiver's office or in a clinic. Each injection takes 2 minutes or less.  Your caregiver will feel for trigger points. The caregiver may use a marker to circle the area for the injection.  The skin over the trigger point will be washed with a germ-killing (antiseptic) solution.  The caregiver pinches the spot for the injection.  Then, a very thin needle is used for the shot. You may feel pain or a twitching feeling when the needle enters the trigger point.  A numbing solution may be injected into the trigger point. Sometimes a drug to keep down swelling, redness, and warmth (inflammation) is also injected.  Your caregiver moves the needle around the trigger zone until the tightness and twitching goes away.  After the injection, your caregiver may put gentle pressure over the injection site.  Then it is covered with a bandage. AFTER THE PROCEDURE  You can go right home after the injection.  The bandage can be taken off after a few hours.  You may feel sore and stiff for 1 to 2 days.  Go back to your regular activities slowly. Your caregiver may ask you to stretch your muscles. Do not do anything that takes   extra energy for a few days.  Follow your caregiver's instructions to manage and treat other pain.   This information is not intended to replace advice given to you by your health care provider. Make sure you discuss any questions you have with your health care provider.   Document Released: 09/29/2011 Document Revised: 02/04/2013 Document Reviewed: 09/29/2011 Else[employer] Interactive Patient Education 2016 [employer] GENERAL RISKS  AND COMPLICATIONS  What are the risk, side effects and possible complications? Generally speaking, most procedures are safe.  However, with any procedure there are risks, side effects, and the possibility of complications.  The risks and complications are dependent upon the sites that are lesioned, or the type of nerve block to be performed.  The closer the procedure is to the spine, the more serious the risks are.  Great care is taken when placing the radio frequency needles, block needles or lesioning probes, but sometimes complications can occur.  Infection: Any time there is an injection through the skin, there is a risk of infection.  This is why sterile conditions are used for these blocks.  There are four possible types of infection.  Localized skin infection.  Central Nervous System Infection-This can be in the form of Meningitis, which can be deadly.  Epidural Infections-This can be in the form of an epidural abscess, which can cause pressure inside of the spine, causing compression of the spinal cord with subsequent paralysis. This would require an emergency surgery to decompress, and there are no guarantees that the patient would recover from the paralysis.  Discitis-This is an infection of the intervertebral discs.  It occurs in about 1% of discography procedures.  It is difficult to treat and it may lead to surgery.        2. Pain: the needles have to go through skin and soft tissues, will cause soreness.       3. Damage to internal structures:  The nerves to be lesioned may be near blood vessels or    other nerves which can be potentially damaged.       4. Bleeding: Bleeding is more common if the patient is taking blood thinners such as  aspirin, Coumadin, Ticiid, Plavix, etc., or if he/she have some genetic predisposition  such as hemophilia. Bleeding into the spinal canal can cause compression of the spinal  cord with subsequent paralysis.  This would require an emergency surgery to   decompress and there are no guarantees that the patient would recover from the  paralysis.       5. Pneumothorax:  Puncturing of a lung is a possibility, every time a needle is introduced in  the area of the chest or upper back.  Pneumothorax refers to free air around the  collapsed lung(s), inside of the thoracic cavity (chest cavity).  Another two possible  complications related to a similar event would include: Hemothorax and Chylothorax.   These are variations of the Pneumothorax, where instead of air around the collapsed  lung(s), you may have blood or chyle, respectively.       6. Spinal headaches: They may occur with any procedures in the area of the spine.       7. Persistent CSF (Cerebro-Spinal Fluid) leakage: This is a rare problem, but may occur  with prolonged intrathecal or epidural catheters either due to the formation of a fistulous  track or a dural tear.       8. Nerve damage: By working so close to the spinal cord, there   is always a possibility of  nerve damage, which could be as serious as a permanent spinal cord injury with  paralysis.       9. Death:  Although rare, severe deadly allergic reactions known as "Anaphylactic  reaction" can occur to any of the medications used.      10. Worsening of the symptoms:  We can always make thing worse.  What are the chances of something like this happening? Chances of any of this occuring are extremely low.  By statistics, you have more of a chance of getting killed in a motor vehicle accident: while driving to the hospital than any of the above occurring .  Nevertheless, you should be aware that they are possibilities.  In general, it is similar to taking a shower.  Everybody knows that you can slip, hit your head and get killed.  Does that mean that you should not shower again?  Nevertheless always keep in mind that statistics do not mean anything if you happen to be on the wrong side of them.  Even if a procedure has a 1 (one) in a 1,000,000  (million) chance of going wrong, it you happen to be that one..Also, keep in mind that by statistics, you have more of a chance of having something go wrong when taking medications.  Who should not have this procedure? If you are on a blood thinning medication (e.g. Coumadin, Plavix, see list of "Blood Thinners"), or if you have an active infection going on, you should not have the procedure.  If you are taking any blood thinners, please inform your physician.  How should I prepare for this procedure?  Do not eat or drink anything at least six hours prior to the procedure.  Bring a driver with you .  It cannot be a taxi.  Come accompanied by an adult that can drive you back, and that is strong enough to help you if your legs get weak or numb from the local anesthetic.  Take all of your medicines the morning of the procedure with just enough water to swallow them.  If you have diabetes, make sure that you are scheduled to have your procedure done first thing in the morning, whenever possible.  If you have diabetes, take only half of your insulin dose and notify our nurse that you have done so as soon as you arrive at the clinic.  If you are diabetic, but only take blood sugar pills (oral hypoglycemic), then do not take them on the morning of your procedure.  You may take them after you have had the procedure.  Do not take aspirin or any aspirin-containing medications, at least eleven (11) days prior to the procedure.  They may prolong bleeding.  Wear loose fitting clothing that may be easy to take off and that you would not mind if it got stained with Betadine or blood.  Do not wear any jewelry or perfume  Remove any nail coloring.  It will interfere with some of our monitoring equipment.  NOTE: Remember that this is not meant to be interpreted as a complete list of all possible complications.  Unforeseen problems may occur.  BLOOD THINNERS The following drugs contain aspirin or other  products, which can cause increased bleeding during surgery and should not be taken for 2 weeks prior to and 1 week after surgery.  If you should need take something for relief of minor pain, you may take acetaminophen which is found in Tylenol,m Datril, Anacin-3  and Panadol. It is not blood thinner. The products listed below are.  Do not take any of the products listed below in addition to any listed on your instruction sheet.  A.P.C or A.P.C with Codeine Codeine Phosphate Capsules #3 Ibuprofen Ridaura  ABC compound Congesprin Imuran rimadil  Advil Cope Indocin Robaxisal  Alka-Seltzer Effervescent Pain Reliever and Antacid Coricidin or Coricidin-D  Indomethacin Rufen  Alka-Seltzer plus Cold Medicine Cosprin Ketoprofen S-A-C Tablets  Anacin Analgesic Tablets or Capsules Coumadin Korlgesic Salflex  Anacin Extra Strength Analgesic tablets or capsules CP-2 Tablets Lanoril Salicylate  Anaprox Cuprimine Capsules Levenox Salocol  Anexsia-D Dalteparin Magan Salsalate  Anodynos Darvon compound Magnesium Salicylate Sine-off  Ansaid Dasin Capsules Magsal Sodium Salicylate  Anturane Depen Capsules Marnal Soma  APF Arthritis pain formula Dewitt's Pills Measurin Stanback  Argesic Dia-Gesic Meclofenamic Sulfinpyrazone  Arthritis Bayer Timed Release Aspirin Diclofenac Meclomen Sulindac  Arthritis pain formula Anacin Dicumarol Medipren Supac  Analgesic (Safety coated) Arthralgen Diffunasal Mefanamic Suprofen  Arthritis Strength Bufferin Dihydrocodeine Mepro Compound Suprol  Arthropan liquid Dopirydamole Methcarbomol with Aspirin Synalgos  ASA tablets/Enseals Disalcid Micrainin Tagament  Ascriptin Doan's Midol Talwin  Ascriptin A/D Dolene Mobidin Tanderil  Ascriptin Extra Strength Dolobid Moblgesic Ticlid  Ascriptin with Codeine Doloprin or Doloprin with Codeine Momentum Tolectin  Asperbuf Duoprin Mono-gesic Trendar  Aspergum Duradyne Motrin or Motrin IB Triminicin  Aspirin plain, buffered or enteric  coated Durasal Myochrisine Trigesic  Aspirin Suppositories Easprin Nalfon Trillsate  Aspirin with Codeine Ecotrin Regular or Extra Strength Naprosyn Uracel  Atromid-S Efficin Naproxen Ursinus  Auranofin Capsules Elmiron Neocylate Vanquish  Axotal Emagrin Norgesic Verin  Azathioprine Empirin or Empirin with Codeine Normiflo Vitamin E  Azolid Emprazil Nuprin Voltaren  Bayer Aspirin plain, buffered or children's or timed BC Tablets or powders Encaprin Orgaran Warfarin Sodium  Buff-a-Comp Enoxaparin Orudis Zorpin  Buff-a-Comp with Codeine Equegesic Os-Cal-Gesic   Buffaprin Excedrin plain, buffered or Extra Strength Oxalid   Bufferin Arthritis Strength Feldene Oxphenbutazone   Bufferin plain or Extra Strength Feldene Capsules Oxycodone with Aspirin   Bufferin with Codeine Fenoprofen Fenoprofen Pabalate or Pabalate-SF   Buffets II Flogesic Panagesic   Buffinol plain or Extra Strength Florinal or Florinal with Codeine Panwarfarin   Buf-Tabs Flurbiprofen Penicillamine   Butalbital Compound Four-way cold tablets Penicillin   Butazolidin Fragmin Pepto-Bismol   Carbenicillin Geminisyn Percodan   Carna Arthritis Reliever Geopen Persantine   Carprofen Gold's salt Persistin   Chloramphenicol Goody's Phenylbutazone   Chloromycetin Haltrain Piroxlcam   Clmetidine heparin Plaquenil   Cllnoril Hyco-pap Ponstel   Clofibrate Hydroxy chloroquine Propoxyphen         Before stopping any of these medications, be sure to consult the physician who ordered them.  Some, such as Coumadin (Warfarin) are ordered to prevent or treat serious conditions such as "deep thrombosis", "pumonary embolisms", and other heart problems.  The amount of time that you may need off of the medication may also vary with the medication and the reason for which you were taking it.  If you are taking any of these medications, please make sure you notify your pain physician before you undergo any procedures.         GENERAL RISKS  AND COMPLICATIONS  What are the risk, side effects and possible complications? Generally speaking, most procedures are safe.  However, with any procedure there are risks, side effects, and the possibility of complications.  The risks and complications are dependent upon the sites that are lesioned, or the type of nerve block to be  performed.  The closer the procedure is to the spine, the more serious the risks are.  Great care is taken when placing the radio frequency needles, block needles or lesioning probes, but sometimes complications can occur.  Infection: Any time there is an injection through the skin, there is a risk of infection.  This is why sterile conditions are used for these blocks.  There are four possible types of infection.  Localized skin infection.  Central Nervous System Infection-This can be in the form of Meningitis, which can be deadly.  Epidural Infections-This can be in the form of an epidural abscess, which can cause pressure inside of the spine, causing compression of the spinal cord with subsequent paralysis. This would require an emergency surgery to decompress, and there are no guarantees that the patient would recover from the paralysis.  Discitis-This is an infection of the intervertebral discs.  It occurs in about 1% of discography procedures.  It is difficult to treat and it may lead to surgery.        2. Pain: the needles have to go through skin and soft tissues, will cause soreness.       3. Damage to internal structures:  The nerves to be lesioned may be near blood vessels or    other nerves which can be potentially damaged.       4. Bleeding: Bleeding is more common if the patient is taking blood thinners such as  aspirin, Coumadin, Ticiid, Plavix, etc., or if he/she have some genetic predisposition  such as hemophilia. Bleeding into the spinal canal can cause compression of the spinal  cord with subsequent paralysis.  This would require an emergency surgery to   decompress and there are no guarantees that the patient would recover from the  paralysis.       5. Pneumothorax:  Puncturing of a lung is a possibility, every time a needle is introduced in  the area of the chest or upper back.  Pneumothorax refers to free air around the  collapsed lung(s), inside of the thoracic cavity (chest cavity).  Another two possible  complications related to a similar event would include: Hemothorax and Chylothorax.   These are variations of the Pneumothorax, where instead of air around the collapsed  lung(s), you may have blood or chyle, respectively.       6. Spinal headaches: They may occur with any procedures in the area of the spine.       7. Persistent CSF (Cerebro-Spinal Fluid) leakage: This is a rare problem, but may occur  with prolonged intrathecal or epidural catheters either due to the formation of a fistulous  track or a dural tear.       8. Nerve damage: By working so close to the spinal cord, there is always a possibility of  nerve damage, which could be as serious as a permanent spinal cord injury with  paralysis.       9. Death:  Although rare, severe deadly allergic reactions known as "Anaphylactic  reaction" can occur to any of the medications used.      10. Worsening of the symptoms:  We can always make thing worse.  What are the chances of something like this happening? Chances of any of this occuring are extremely low.  By statistics, you have more of a chance of getting killed in a motor vehicle accident: while driving to the hospital than any of the above occurring .  Nevertheless, you should be aware that they  are possibilities.  In general, it is similar to taking a shower.  Everybody knows that you can slip, hit your head and get killed.  Does that mean that you should not shower again?  Nevertheless always keep in mind that statistics do not mean anything if you happen to be on the wrong side of them.  Even if a procedure has a 1 (one) in a 1,000,000  (million) chance of going wrong, it you happen to be that one..Also, keep in mind that by statistics, you have more of a chance of having something go wrong when taking medications.  Who should not have this procedure? If you are on a blood thinning medication (e.g. Coumadin, Plavix, see list of "Blood Thinners"), or if you have an active infection going on, you should not have the procedure.  If you are taking any blood thinners, please inform your physician.  How should I prepare for this procedure?  Do not eat or drink anything at least six hours prior to the procedure.  Bring a driver with you .  It cannot be a taxi.  Come accompanied by an adult that can drive you back, and that is strong enough to help you if your legs get weak or numb from the local anesthetic.  Take all of your medicines the morning of the procedure with just enough water to swallow them.  If you have diabetes, make sure that you are scheduled to have your procedure done first thing in the morning, whenever possible.  If you have diabetes, take only half of your insulin dose and notify our nurse that you have done so as soon as you arrive at the clinic.  If you are diabetic, but only take blood sugar pills (oral hypoglycemic), then do not take them on the morning of your procedure.  You may take them after you have had the procedure.  Do not take aspirin or any aspirin-containing medications, at least eleven (11) days prior to the procedure.  They may prolong bleeding.  Wear loose fitting clothing that may be easy to take off and that you would not mind if it got stained with Betadine or blood.  Do not wear any jewelry or perfume  Remove any nail coloring.  It will interfere with some of our monitoring equipment.  NOTE: Remember that this is not meant to be interpreted as a complete list of all possible complications.  Unforeseen problems may occur.  BLOOD THINNERS The following drugs contain aspirin or other  products, which can cause increased bleeding during surgery and should not be taken for 2 weeks prior to and 1 week after surgery.  If you should need take something for relief of minor pain, you may take acetaminophen which is found in Tylenol,m Datril, Anacin-3 and Panadol. It is not blood thinner. The products listed below are.  Do not take any of the products listed below in addition to any listed on your instruction sheet.  A.P.C or A.P.C with Codeine Codeine Phosphate Capsules #3 Ibuprofen Ridaura  ABC compound Congesprin Imuran rimadil  Advil Cope Indocin Robaxisal  Alka-Seltzer Effervescent Pain Reliever and Antacid Coricidin or Coricidin-D  Indomethacin Rufen  Alka-Seltzer plus Cold Medicine Cosprin Ketoprofen S-A-C Tablets  Anacin Analgesic Tablets or Capsules Coumadin Korlgesic Salflex  Anacin Extra Strength Analgesic tablets or capsules CP-2 Tablets Lanoril Salicylate  Anaprox Cuprimine Capsules Levenox Salocol  Anexsia-D Dalteparin Magan Salsalate  Anodynos Darvon compound Magnesium Salicylate Sine-off  Ansaid Dasin Capsules Magsal Sodium Salicylate  Anturane Depen Capsules Marnal Soma  APF Arthritis pain formula Dewitt's Pills Measurin Stanback  Argesic Dia-Gesic Meclofenamic Sulfinpyrazone  Arthritis Bayer Timed Release Aspirin Diclofenac Meclomen Sulindac  Arthritis pain formula Anacin Dicumarol Medipren Supac  Analgesic (Safety coated) Arthralgen Diffunasal Mefanamic Suprofen  Arthritis Strength Bufferin Dihydrocodeine Mepro Compound Suprol  Arthropan liquid Dopirydamole Methcarbomol with Aspirin Synalgos  ASA tablets/Enseals Disalcid Micrainin Tagament  Ascriptin Doan's Midol Talwin  Ascriptin A/D Dolene Mobidin Tanderil  Ascriptin Extra Strength Dolobid Moblgesic Ticlid  Ascriptin with Codeine Doloprin or Doloprin with Codeine Momentum Tolectin  Asperbuf Duoprin Mono-gesic Trendar  Aspergum Duradyne Motrin or Motrin IB Triminicin  Aspirin plain, buffered or enteric  coated Durasal Myochrisine Trigesic  Aspirin Suppositories Easprin Nalfon Trillsate  Aspirin with Codeine Ecotrin Regular or Extra Strength Naprosyn Uracel  Atromid-S Efficin Naproxen Ursinus  Auranofin Capsules Elmiron Neocylate Vanquish  Axotal Emagrin Norgesic Verin  Azathioprine Empirin or Empirin with Codeine Normiflo Vitamin E  Azolid Emprazil Nuprin Voltaren  Bayer Aspirin plain, buffered or children's or timed BC Tablets or powders Encaprin Orgaran Warfarin Sodium  Buff-a-Comp Enoxaparin Orudis Zorpin  Buff-a-Comp with Codeine Equegesic Os-Cal-Gesic   Buffaprin Excedrin plain, buffered or Extra Strength Oxalid   Bufferin Arthritis Strength Feldene Oxphenbutazone   Bufferin plain or Extra Strength Feldene Capsules Oxycodone with Aspirin   Bufferin with Codeine Fenoprofen Fenoprofen Pabalate or Pabalate-SF   Buffets II Flogesic Panagesic   Buffinol plain or Extra Strength Florinal or Florinal with Codeine Panwarfarin   Buf-Tabs Flurbiprofen Penicillamine   Butalbital Compound Four-way cold tablets Penicillin   Butazolidin Fragmin Pepto-Bismol   Carbenicillin Geminisyn Percodan   Carna Arthritis Reliever Geopen Persantine   Carprofen Gold's salt Persistin   Chloramphenicol Goody's Phenylbutazone   Chloromycetin Haltrain Piroxlcam   Clmetidine heparin Plaquenil   Cllnoril Hyco-pap Ponstel   Clofibrate Hydroxy chloroquine Propoxyphen         Before stopping any of these medications, be sure to consult the physician who ordered them.  Some, such as Coumadin (Warfarin) are ordered to prevent or treat serious conditions such as "deep thrombosis", "pumonary embolisms", and other heart problems.  The amount of time that you may need off of the medication may also vary with the medication and the reason for which you were taking it.  If you are taking any of these medications, please make sure you notify your pain physician before you undergo any procedures.

## 2016-04-21 NOTE — Progress Notes (Signed)
Safety precautions to be maintained throughout the outpatient stay will include: orient to surroundings, keep bed in low position, maintain call bell within reach at all times, provide assistance with transfer out of bed and ambulation.  

## 2016-05-02 ENCOUNTER — Telehealth: Payer: Self-pay | Admitting: Anesthesiology

## 2016-05-02 NOTE — Telephone Encounter (Signed)
Attempted to call patient with no answer.  °

## 2016-05-02 NOTE — Telephone Encounter (Signed)
Patient needs meds refill appt by 05-19-16, there are no appts available, please advise if patient can pick up script , I informed patient there were no appts available at this time and we would give her a call to let her know options

## 2016-05-03 NOTE — Telephone Encounter (Signed)
No answer    Left voicemail

## 2016-05-04 NOTE — Telephone Encounter (Signed)
Spoke with patient and added to 05-19-17 per Dr. Pernell DupreAdams for med refill

## 2016-05-19 ENCOUNTER — Encounter: Payer: Self-pay | Admitting: Anesthesiology

## 2016-05-19 ENCOUNTER — Ambulatory Visit: Payer: 59 | Attending: Anesthesiology | Admitting: Anesthesiology

## 2016-05-19 VITALS — BP 125/82 | HR 84 | Temp 98.6°F | Resp 16 | Ht 62.0 in | Wt 119.0 lb

## 2016-05-19 DIAGNOSIS — M79605 Pain in left leg: Secondary | ICD-10-CM | POA: Diagnosis present

## 2016-05-19 DIAGNOSIS — M79604 Pain in right leg: Secondary | ICD-10-CM | POA: Insufficient documentation

## 2016-05-19 DIAGNOSIS — M5030 Other cervical disc degeneration, unspecified cervical region: Secondary | ICD-10-CM | POA: Diagnosis not present

## 2016-05-19 DIAGNOSIS — M5136 Other intervertebral disc degeneration, lumbar region: Secondary | ICD-10-CM | POA: Insufficient documentation

## 2016-05-19 DIAGNOSIS — M25511 Pain in right shoulder: Secondary | ICD-10-CM | POA: Insufficient documentation

## 2016-05-19 DIAGNOSIS — M797 Fibromyalgia: Secondary | ICD-10-CM

## 2016-05-19 DIAGNOSIS — M5441 Lumbago with sciatica, right side: Secondary | ICD-10-CM | POA: Diagnosis not present

## 2016-05-19 DIAGNOSIS — F419 Anxiety disorder, unspecified: Secondary | ICD-10-CM | POA: Diagnosis not present

## 2016-05-19 DIAGNOSIS — M7918 Myalgia, other site: Secondary | ICD-10-CM

## 2016-05-19 DIAGNOSIS — M5442 Lumbago with sciatica, left side: Secondary | ICD-10-CM | POA: Diagnosis not present

## 2016-05-19 DIAGNOSIS — M5010 Cervical disc disorder with radiculopathy, unspecified cervical region: Secondary | ICD-10-CM | POA: Diagnosis not present

## 2016-05-19 DIAGNOSIS — F17210 Nicotine dependence, cigarettes, uncomplicated: Secondary | ICD-10-CM | POA: Diagnosis not present

## 2016-05-19 DIAGNOSIS — M5431 Sciatica, right side: Secondary | ICD-10-CM

## 2016-05-19 DIAGNOSIS — M5432 Sciatica, left side: Secondary | ICD-10-CM

## 2016-05-19 DIAGNOSIS — M542 Cervicalgia: Secondary | ICD-10-CM | POA: Diagnosis not present

## 2016-05-19 DIAGNOSIS — I10 Essential (primary) hypertension: Secondary | ICD-10-CM | POA: Diagnosis not present

## 2016-05-19 MED ORDER — ROPIVACAINE HCL 2 MG/ML IJ SOLN: 10.0000 mL | Freq: Once | INTRAMUSCULAR | Status: AC

## 2016-05-19 MED ORDER — OXYCODONE-ACETAMINOPHEN 5-325 MG PO TABS: 1.0000 | ORAL_TABLET | Freq: Four times a day (QID) | ORAL | 0 refills | Status: DC | PRN

## 2016-05-19 MED ORDER — DEXAMETHASONE SODIUM PHOSPHATE 10 MG/ML IJ SOLN
INTRAMUSCULAR | Status: AC
  Administered 2016-05-19: 15:00:00
  Filled 2016-05-19: qty 1

## 2016-05-19 MED ORDER — TRIAMCINOLONE ACETONIDE 40 MG/ML IJ SUSP: 40.0000 mg | Freq: Once | INTRAMUSCULAR | Status: AC

## 2016-05-19 MED ORDER — ROPIVACAINE HCL 2 MG/ML IJ SOLN
INTRAMUSCULAR | Status: AC
  Administered 2016-05-19: 15:00:00
  Filled 2016-05-19: qty 10

## 2016-05-19 NOTE — Patient Instructions (Addendum)
Epidural Steroid Injection Patient Information  Description: The epidural space surrounds the nerves as they exit the spinal cord.  In some patients, the nerves can be compressed and inflamed by a bulging disc or a tight spinal canal (spinal stenosis).  By injecting steroids into the epidural space, we can bring irritated nerves into direct contact with a potentially helpful medication.  These steroids act directly on the irritated nerves and can reduce swelling and inflammation which often leads to decreased pain.  Epidural steroids may be injected anywhere along the spine and from the neck to the low back depending upon the location of your pain.   After numbing the skin with local anesthetic (like Novocaine), a small needle is passed into the epidural space slowly.  You may experience a sensation of pressure while this is being done.  The entire block usually last less than 10 minutes.  Conditions which may be treated by epidural steroids:   Low back and leg pain  Neck and arm pain  Spinal stenosis  Post-laminectomy syndrome  Herpes zoster (shingles) pain  Pain from compression fractures  Preparation for the injection:  1. Do not eat any solid food or dairy products within 8 hours of your appointment.  2. You may drink clear liquids up to 3 hours before appointment.  Clear liquids include water, black coffee, juice or soda.  No milk or cream please. 3. You may take your regular medication, including pain medications, with a sip of water before your appointment  Diabetics should hold regular insulin (if taken separately) and take 1/2 normal NPH dos the morning of the procedure.  Carry some sugar containing items with you to your appointment. 4. A driver must accompany you and be prepared to drive you home after your procedure.  5. Bring all your current medications with your. 6. An IV may be inserted and sedation may be given at the discretion of the physician.   7. A blood pressure  cuff, EKG and other monitors will often be applied during the procedure.  Some patients may need to have extra oxygen administered for a short period. 8. You will be asked to provide medical information, including your allergies, prior to the procedure.  We must know immediately if you are taking blood thinners (like Coumadin/Warfarin)  Or if you are allergic to IV iodine contrast (dye). We must know if you could possible be pregnant.  Possible side-effects:  Bleeding from needle site  Infection (rare, may require surgery)  Nerve injury (rare)  Numbness & tingling (temporary)  Difficulty urinating (rare, temporary)  Spinal headache ( a headache worse with upright posture)  Light -headedness (temporary)  Pain at injection site (several days)  Decreased blood pressure (temporary)  Weakness in arm/leg (temporary)  Pressure sensation in back/neck (temporary)  Call if you experience:  Fever/chills associated with headache or increased back/neck pain.  Headache worsened by an upright position.  New onset weakness or numbness of an extremity below the injection site  Hives or difficulty breathing (go to the emergency room)  Inflammation or drainage at the infection site  Severe back/neck pain  Any new symptoms which are concerning to you  Please note:  Although the local anesthetic injected can often make your back or neck feel good for several hours after the injection, the pain will likely return.  It takes 3-7 days for steroids to work in the epidural space.  You may not notice any pain relief for at least that one week.    If effective, we will often do a series of three injections spaced 3-6 weeks apart to maximally decrease your pain.  After the initial series, we generally will wait several months before considering a repeat injection of the same type.  If you have any questions, please call [phone] [facility]  Discharge Instructions  General Discharge Instructions :  If you need to reach your doctor call: Monday-Friday 8:00 am - 4:00 pm at [phone] or toll free [phone].  After clinic hours [phone] to have operator reach doctor.  Bring all of your medication bottles to all your appointments in the pain clinic.  To cancel or reschedule your appointment with Pain Management please remember to call 24 hours in advance to avoid a fee.  Refer to the educational materials which you have been given on: General Risks, I had my Procedure. Discharge Instructions, Post Sedation.  Post Procedure Instructions:  The drugs you were given will stay in your system until tomorrow, so for the next 24 hours you should not drive, make any legal decisions or drink any alcoholic beverages.  You may eat anything you prefer, but it is better to start with liquids then soups and crackers, and gradually work up to solid foods.  Please notify your doctor immediately if you have any unusual bleeding, trouble breathing or pain that is not related to your normal pain.  Depending on the type of procedure that was done, some parts of your body may feel week and/or numb.  This usually clears up by tonight or the next day.  Walk with the use of an assistive device or accompanied by an adult for the 24 hours.  You may use ice on the affected area for the first 24 hours.  Put ice in a Ziploc bag and cover with a towel and place against area 15 minutes on 15 minutes off.  You may switch to heat after 24 hours. You were given one prescription for Oxycodone today

## 2016-05-20 NOTE — Progress Notes (Signed)
Subjective:  Patient ID: Holly Wilkinson, female    DOB: [birth year]  Age: 45 y.o. MRN: [id number]  CC: Shoulder Pain (right) and Leg Pain (bilateral legs from knees down to feet)  Procedure: Trigger point injection 3 to the right parascapular region   HPI Holly Wilkinson presents for reevaluation today.She was last seen a month ago and continues to do well in regards to her recent ACD at 2 levels. She is taking her medications including Percocet 10 mg 3 times a day and this is well tolerated. She is having some burning and spasming in the right proximal trapezius region but her upper extremity strength and function has been well preserved. This pain is worse with activity and has failed to improve with medication management or stretching exercises. She is requesting injection for pain relief today.   Furthermore she is having worsening low back pain centralized in the low back that is now radiating down both legs and into her posterior calves. She has had this intermittently in the past however it is now become more continuous in nature and severe. She gets calf cramping especially when she's been standing for any period of time. She reports No issues with bowel or bladder function  today. She denies having had any previous MRI of the lumbar spine.  History Holly Wilkinson has a past medical history of Anxiety and Hypertension.   She has a past surgical history that includes Abdominal hysterectomy; Cesarean section; and Neck surgery (Right, 02/23/16).   Her family history includes Arthritis in her mother; COPD in her maternal uncle; Cancer in her mother; Diabetes in her mother; Hypertension in her mother.She reports that she has been smoking Cigarettes.  She has been smoking about 0.50 packs per day. She does not have any smokeless tobacco history on file. She reports that she does not drink alcohol or use  drugs.   ---------------------------------------------------------------------------------------------------------------------- Past Medical History:  Diagnosis Date  . Anxiety   . Hypertension     Past Surgical History:  Procedure Laterality Date  . ABDOMINAL HYSTERECTOMY    . CESAREAN SECTION    . NECK SURGERY Right 02/23/16   2 disc replaced.     Family History  Problem Relation Age of Onset  . Arthritis Mother   . Cancer Mother   . Diabetes Mother   . Hypertension Mother   . COPD Maternal Uncle     Social History  Substance Use Topics  . Smoking status: Current Every Day Smoker    Packs/day: 0.50    Types: Cigarettes  . Smokeless tobacco: Not on file  . Alcohol use No    ---------------------------------------------------------------------------------------------------------------------- Social History   Social History  . Marital status: Married    Spouse name: N/A  . Number of children: N/A  . Years of education: N/A   Social History Main Topics  . Smoking status: Current Every Day Smoker    Packs/day: 0.50    Types: Cigarettes  . Smokeless tobacco: None  . Alcohol use No  . Drug use: No  . Sexual activity: Not Asked   Other Topics Concern  . None   Social History Narrative  . None      ----------------------------------------------------------------------------------------------------------------------  ROS Review of Systems  Cardiac: High blood pressure Pulmonary: Smoker Neurologic: Anxiety Psychologic: Negative other than anxiety GI: Negative  Objective:  BP 125/82   Pulse 84   Temp 98.6 F (37 C) (Oral)   Resp 16   Ht 5\' 2"  (1.575 m)   Wt 119  lb (54 kg)   SpO2 99%   BMI 21.77 kg/m   Physical Exam  Patient is a alert oriented and cooperative Good historian Extraocular muscles intact Heart is regular rate and rhythm Lungs are clear to also dictation There is a well-healed trigger in the proximal right  trapezius Furthermore with the patient in the supine position she has a positive straight leg raise on the left side causing calf cramping with S1 radiculitis and back pain and calf cramping on the right side to a lesser extent than left. Her strength to the lower extremities appears to be well-preserved with intact sensation good muscle tone and bulk.  Assessment & Plan:   Holly Wilkinson was seen today for shoulder pain and leg pain.  Diagnoses and all orders for this visit:  Cervicalgia -     triamcinolone acetonide (KENALOG-40) injection 40 mg; 1 mL (40 mg total) by Other route once. -     ropivacaine (PF) 2 mg/ml (0.2%) (NAROPIN) epidural 10 mL; 10 mLs by Epidural route once.  Myofacial muscle pain -     triamcinolone acetonide (KENALOG-40) injection 40 mg; 1 mL (40 mg total) by Other route once. -     ropivacaine (PF) 2 mg/ml (0.2%) (NAROPIN) epidural 10 mL; 10 mLs by Epidural route once.  DDD (degenerative disc disease), cervical  Sciatica associated with disorder of lumbar spine, right -     Lumbar Epidural Injection; Future  Sciatica associated with disorder of lumbar spine, left -     Lumbar Epidural Injection; Future  Other orders -     ropivacaine (PF) 2 mg/ml (0.2%) (NAROPIN) 2 MG/ML epidural;  -     dexamethasone (DECADRON) 10 MG/ML injection;  -     oxyCODONE-acetaminophen (ROXICET) 5-325 MG tablet; Take 1 tablet by mouth every 6 (six) hours as needed for severe pain.     ----------------------------------------------------------------------------------------------------------------------  Problem List Items Addressed This Visit    None    Visit Diagnoses    Cervicalgia    -  Primary   Relevant Medications   triamcinolone acetonide (KENALOG-40) injection 40 mg   ropivacaine (PF) 2 mg/ml (0.2%) (NAROPIN) epidural 10 mL   Myofacial muscle pain       Relevant Medications   triamcinolone acetonide (KENALOG-40) injection 40 mg   ropivacaine (PF) 2 mg/ml (0.2%) (NAROPIN)  epidural 10 mL   DDD (degenerative disc disease), cervical       Relevant Medications   triamcinolone acetonide (KENALOG-40) injection 40 mg   dexamethasone (DECADRON) 10 MG/ML injection (Completed)   oxyCODONE-acetaminophen (ROXICET) 5-325 MG tablet   Sciatica associated with disorder of lumbar spine, right       Relevant Orders   Lumbar Epidural Injection   Sciatica associated with disorder of lumbar spine, left       Relevant Orders   Lumbar Epidural Injection      ----------------------------------------------------------------------------------------------------------------------  1. Cervicalgia  2. DDD (degenerative disc disease), cervical Continue follow-up with Dr. Danielle Dess  3. Facet arthritis of cervical region (HCC)  3. Cervical radiculitis continue doing stretching strengthening exercises as per physical therapy and as prescribed by her neurosurgeon. Return to clinic in 1 month and continue current medication regimen. We will wean her medications as noted with her to the 5 mg 4 times a day dosing with an effort to discontinue within the next few months. Continue with physical therapy exercises with trigger point injections today  4. Lumbar DDD: We will plan on an epidural steroid injection at her next visit.  The risks and benefits of the procedure have been reviewed and full detail all questions answered. We will also proceed with refill of her medication management today and a hopeful reduction in total dosing at her next visit in a month.   ----------------------------------------------------------------------------------------------------------------------  I have discontinued Holly Wilkinson's promethazine. I am also having her maintain her clonazePAM, escitalopram, losartan, albuterol, amLODipine, methocarbamol, and oxyCODONE-acetaminophen. We administered ropivacaine (PF) 2 mg/ml (0.2%) and dexamethasone. We will continue to administer triamcinolone acetonide and ropivacaine  (PF) 2 mg/ml (0.2%).   Meds ordered this encounter  Medications  . triamcinolone acetonide (KENALOG-40) injection 40 mg  . ropivacaine (PF) 2 mg/ml (0.2%) (NAROPIN) epidural 10 mL  . ropivacaine (PF) 2 mg/ml (0.2%) (NAROPIN) 2 MG/ML epidural    Holly Wilkinson, Holly Wilkinson: cabinet override  . dexamethasone (DECADRON) 10 MG/ML injection    Holly Wilkinson, Holly Wilkinson: cabinet override  . oxyCODONE-acetaminophen (ROXICET) 5-325 MG tablet    Sig: Take 1 tablet by mouth every 6 (six) hours as needed for severe pain.    Dispense:  120 tablet    Refill:  0       Follow-up: Return in about 3 weeks (around 06/09/2016) for evaluation, procedure.   Procedure: Trigger point injection: After mentioned trigger points in the right trapezius were prepped with alcohol and injected 3 with 3 cc ropivacaine 0.2% mixed with 3 mg of Decadron in a fanlike distribution via a 25-gauge needle. This was well-tolerated and she was convalesced discharged to home in stable condition for follow-up as mentioned.  Yevette Edwards, MD

## 2016-06-01 ENCOUNTER — Telehealth: Payer: Self-pay

## 2016-06-01 NOTE — Telephone Encounter (Signed)
Having extreme burning in feet. Informed patient that Dr. Pernell DupreAdams not here until next week, will speak with him about this at that time.

## 2016-06-01 NOTE — Telephone Encounter (Signed)
Pt is having extreme pain on shoulder blades that shoots down to her feet pt says she can't stop crying

## 2016-06-08 NOTE — Telephone Encounter (Signed)
patient would like to go up on the milligrams of her oxycodone . I instructed her to discuss next week at her visit and prior to procedure /sedation.

## 2016-06-14 ENCOUNTER — Ambulatory Visit: Payer: 59 | Attending: Anesthesiology | Admitting: Anesthesiology

## 2016-06-14 ENCOUNTER — Encounter: Payer: Self-pay | Admitting: Anesthesiology

## 2016-06-14 VITALS — BP 130/81 | HR 74 | Temp 98.3°F | Resp 12 | Ht 62.0 in | Wt 119.0 lb

## 2016-06-14 DIAGNOSIS — M5030 Other cervical disc degeneration, unspecified cervical region: Secondary | ICD-10-CM

## 2016-06-14 DIAGNOSIS — I10 Essential (primary) hypertension: Secondary | ICD-10-CM | POA: Insufficient documentation

## 2016-06-14 DIAGNOSIS — F17210 Nicotine dependence, cigarettes, uncomplicated: Secondary | ICD-10-CM | POA: Insufficient documentation

## 2016-06-14 DIAGNOSIS — M5412 Radiculopathy, cervical region: Secondary | ICD-10-CM

## 2016-06-14 DIAGNOSIS — Z90710 Acquired absence of both cervix and uterus: Secondary | ICD-10-CM | POA: Diagnosis not present

## 2016-06-14 DIAGNOSIS — M542 Cervicalgia: Secondary | ICD-10-CM | POA: Diagnosis not present

## 2016-06-14 DIAGNOSIS — F419 Anxiety disorder, unspecified: Secondary | ICD-10-CM | POA: Insufficient documentation

## 2016-06-14 DIAGNOSIS — M5136 Other intervertebral disc degeneration, lumbar region: Secondary | ICD-10-CM | POA: Diagnosis not present

## 2016-06-14 DIAGNOSIS — M5432 Sciatica, left side: Secondary | ICD-10-CM | POA: Diagnosis not present

## 2016-06-14 DIAGNOSIS — M5431 Sciatica, right side: Secondary | ICD-10-CM | POA: Diagnosis not present

## 2016-06-14 DIAGNOSIS — M5010 Cervical disc disorder with radiculopathy, unspecified cervical region: Secondary | ICD-10-CM | POA: Insufficient documentation

## 2016-06-14 DIAGNOSIS — M545 Low back pain: Secondary | ICD-10-CM | POA: Insufficient documentation

## 2016-06-14 MED ORDER — SODIUM CHLORIDE 0.9 % IJ SOLN
INTRAMUSCULAR | Status: AC
  Administered 2016-06-14: 15:00:00
  Filled 2016-06-14: qty 10

## 2016-06-14 MED ORDER — LIDOCAINE HCL (PF) 1 % IJ SOLN
5.0000 mL | Freq: Once | INTRAMUSCULAR | Status: AC
  Administered 2016-06-14: 5 mL via SUBCUTANEOUS
  Filled 2016-06-14: qty 5

## 2016-06-14 MED ORDER — TRIAMCINOLONE ACETONIDE 40 MG/ML IJ SUSP
40.0000 mg | Freq: Once | INTRAMUSCULAR | Status: AC
  Administered 2016-06-14: 40 mg
  Filled 2016-06-14: qty 1

## 2016-06-14 MED ORDER — IOPAMIDOL (ISOVUE-M 200) INJECTION 41%
INTRAMUSCULAR | Status: AC
  Administered 2016-06-14: 15:00:00
  Filled 2016-06-14: qty 10

## 2016-06-14 MED ORDER — DEXAMETHASONE SODIUM PHOSPHATE 4 MG/ML IJ SOLN
4.0000 mg | Freq: Once | INTRAMUSCULAR | Status: AC
  Administered 2016-06-14: 4 mg
  Filled 2016-06-14: qty 1

## 2016-06-14 MED ORDER — OXYCODONE-ACETAMINOPHEN 5-325 MG PO TABS: 1.0000 | ORAL_TABLET | Freq: Four times a day (QID) | ORAL | 0 refills | Status: DC | PRN

## 2016-06-14 MED ORDER — MIDAZOLAM HCL 2 MG/2ML IJ SOLN: 5.0000 mg | Freq: Once | INTRAMUSCULAR | Status: AC

## 2016-06-14 MED ORDER — LACTATED RINGERS IV SOLN
1000.0000 mL | INTRAVENOUS | Status: AC
  Administered 2016-07-19: 1000 mL via INTRAVENOUS

## 2016-06-14 MED ORDER — SODIUM CHLORIDE 0.9% FLUSH: 10.0000 mL | Freq: Once | INTRAVENOUS | Status: AC

## 2016-06-14 MED ORDER — ROPIVACAINE HCL 2 MG/ML IJ SOLN
10.0000 mL | Freq: Once | INTRAMUSCULAR | Status: AC
  Administered 2016-06-14: 10 mL via EPIDURAL
  Filled 2016-06-14: qty 10

## 2016-06-14 MED ORDER — ROPIVACAINE HCL 2 MG/ML IJ SOLN
INTRAMUSCULAR | Status: AC
  Administered 2016-06-14: 15:00:00
  Filled 2016-06-14: qty 10

## 2016-06-14 NOTE — Patient Instructions (Signed)
Epidural Steroid Injection Patient Information  Description: The epidural space surrounds the nerves as they exit the spinal cord.  In some patients, the nerves can be compressed and inflamed by a bulging disc or a tight spinal canal (spinal stenosis).  By injecting steroids into the epidural space, we can bring irritated nerves into direct contact with a potentially helpful medication.  These steroids act directly on the irritated nerves and can reduce swelling and inflammation which often leads to decreased pain.  Epidural steroids may be injected anywhere along the spine and from the neck to the low back depending upon the location of your pain.   After numbing the skin with local anesthetic (like Novocaine), a small needle is passed into the epidural space slowly.  You may experience a sensation of pressure while this is being done.  The entire block usually last less than 10 minutes.  Conditions which may be treated by epidural steroids:   Low back and leg pain  Neck and arm pain  Spinal stenosis  Post-laminectomy syndrome  Herpes zoster (shingles) pain  Pain from compression fractures  Preparation for the injection:  1. Do not eat any solid food or dairy products within 8 hours of your appointment.  2. You may drink clear liquids up to 3 hours before appointment.  Clear liquids include water, black coffee, juice or soda.  No milk or cream please. 3. You may take your regular medication, including pain medications, with a sip of water before your appointment  Diabetics should hold regular insulin (if taken separately) and take 1/2 normal NPH dos the morning of the procedure.  Carry some sugar containing items with you to your appointment. 4. A driver must accompany you and be prepared to drive you home after your procedure.  5. Bring all your current medications with your. 6. An IV may be inserted and sedation may be given at the discretion of the physician.   7. A blood pressure  cuff, EKG and other monitors will often be applied during the procedure.  Some patients may need to have extra oxygen administered for a short period. 8. You will be asked to provide medical information, including your allergies, prior to the procedure.  We must know immediately if you are taking blood thinners (like Coumadin/Warfarin)  Or if you are allergic to IV iodine contrast (dye). We must know if you could possible be pregnant.  Possible side-effects:  Bleeding from needle site  Infection (rare, may require surgery)  Nerve injury (rare)  Numbness & tingling (temporary)  Difficulty urinating (rare, temporary)  Spinal headache ( a headache worse with upright posture)  Light -headedness (temporary)  Pain at injection site (several days)  Decreased blood pressure (temporary)  Weakness in arm/leg (temporary)  Pressure sensation in back/neck (temporary)  Call if you experience:  Fever/chills associated with headache or increased back/neck pain.  Headache worsened by an upright position.  New onset weakness or numbness of an extremity below the injection site  Hives or difficulty breathing (go to the emergency room)  Inflammation or drainage at the infection site  Severe back/neck pain  Any new symptoms which are concerning to you  Please note:  Although the local anesthetic injected can often make your back or neck feel good for several hours after the injection, the pain will likely return.  It takes 3-7 days for steroids to work in the epidural space.  You may not notice any pain relief for at least that one week.    If effective, we will often do a series of three injections spaced 3-6 weeks apart to maximally decrease your pain.  After the initial series, we generally will wait several months before considering a repeat injection of the same type.  If you have any questions, please call [phone] [facility]  Pain Management  Discharge Instructions  General Discharge Instructions :  If you need to reach your doctor call: Monday-Friday 8:00 am - 4:00 pm at [phone] or toll free [phone].  After clinic hours [phone] to have operator reach doctor.  Bring all of your medication bottles to all your appointments in the pain clinic.  To cancel or reschedule your appointment with Pain Management please remember to call 24 hours in advance to avoid a fee.  Refer to the educational materials which you have been given on: General Risks, I had my Procedure. Discharge Instructions, Post Sedation.  Post Procedure Instructions:  Please notify your doctor immediately if you have any unusual bleeding, trouble breathing or pain that is not related to your normal pain.  Depending on the type of procedure that was done, some parts of your body may feel week and/or numb.  This usually clears up by tonight or the next day.  Walk with the use of an assistive device or accompanied by an adult for the 24 hours.  You may use ice on the affected area for the first 24 hours.  Put ice in a Ziploc bag and cover with a towel and place against area 15 minutes on 15 minutes off.  You may switch to heat after 24 hours.  IMPORTANT: Please fill out the post procedure pain diary and bring it with you at your next appointment.

## 2016-06-14 NOTE — Progress Notes (Signed)
Safety precautions to be maintained throughout the outpatient stay will include: orient to surroundings, keep bed in low position, maintain call bell within reach at all times, provide assistance with transfer out of bed and ambulation.  

## 2016-06-15 ENCOUNTER — Telehealth: Payer: Self-pay | Admitting: Anesthesiology

## 2016-06-15 ENCOUNTER — Telehealth: Payer: Self-pay | Admitting: *Deleted

## 2016-06-15 NOTE — Telephone Encounter (Signed)
Message left

## 2016-06-15 NOTE — Progress Notes (Signed)
Subjective:  Patient ID: Julious Okaracy Milliman, female    DOB: 1971-01-16  Age: 45 y.o. MRN: [id number]  CC: Back Pain (low)  Procedure:L4-5 epidural steroid #1 under fluoroscopic guidance with moderate sedation and Trigger point injection #2 2 to the right and leftr trapezius mid body   HPI Julious Okaracy Sorn presents for reevaluation today.She was last seen a month ago and continues to do well in regards to her recent ACD at 2 levels. She is taking her medications including Percocet 10 mg 3 times a day and this is well tolerated. She is having some burning and spasming in the right and left proximal trapezius region but her upper extremity strength and function has been well preserved. This pain is worse with activity and has failed to improve with medication management or stretching exercises. She is requesting injection for pain relief today and did not get any significant improvement with her last parascapular rhomboid injection..   Furthermore she is having worsening low back pain centralized in the low back that is now radiating down both legs and into her posterior calves. She has had this intermittently in the past however it is now become more continuous in nature and severe. She gets calf cramping especially when she's been standing for any period of time. She reports No issues with bowel or bladder function  today. She denies having had any previous MRI of the lumbar spine. We have previously discussed possible epidural steroid injection and she presents today requesting that.  History [name] has a past medical history of Anxiety and Hypertension.   She has a past surgical history that includes Abdominal hysterectomy; Cesarean section; and Neck surgery (Right, 02/23/16).   Her family history includes Arthritis in her mother; COPD in her maternal uncle; Cancer in her mother; Diabetes in her mother; Hypertension in her mother.She reports that she has been smoking Cigarettes.  She has been smoking  about 0.50 packs per day. She has never used smokeless tobacco. She reports that she does not drink alcohol or use drugs.   ---------------------------------------------------------------------------------------------------------------------- Past Medical History:  Diagnosis Date  . Anxiety   . Hypertension     Past Surgical History:  Procedure Laterality Date  . ABDOMINAL HYSTERECTOMY    . CESAREAN SECTION    . NECK SURGERY Right 02/23/16   2 disc replaced.     Family History  Problem Relation Age of Onset  . Arthritis Mother   . Cancer Mother   . Diabetes Mother   . Hypertension Mother   . COPD Maternal Uncle     Social History  Substance Use Topics  . Smoking status: Current Every Day Smoker    Packs/day: 0.50    Types: Cigarettes  . Smokeless tobacco: Never Used  . Alcohol use No    ---------------------------------------------------------------------------------------------------------------------- Social History   Social History  . Marital status: Married    Spouse name: N/A  . Number of children: N/A  . Years of education: N/A   Social History Main Topics  . Smoking status: Current Every Day Smoker    Packs/day: 0.50    Types: Cigarettes  . Smokeless tobacco: Never Used  . Alcohol use No  . Drug use: No  . Sexual activity: Not Asked   Other Topics Concern  . None   Social History Narrative  . None      ----------------------------------------------------------------------------------------------------------------------  ROS Review of Systems  Cardiac: High blood pressure Pulmonary: Smoker Neurologic: Anxiety Psychologic: Negative other than anxiety GI: Negative  Objective:  BP 130/81   Pulse 74   Temp 98.3 F (36.8 C) (Oral)   Resp 12   Ht 5\' 2"  (1.575 m)   Wt 119 lb (54 kg)   SpO2 100%   BMI 21.77 kg/m   Physical Exam  Patient is a alert oriented and cooperative Good historian Extraocular muscles intact Heart is regular  rate and rhythm Lungs are clear to also dictation There is a well-Defined trigger in the proximal right and left mid body trapezius which does reproduce the majority of her cervical pain Furthermore with the patient in the supine position she has a positive straight leg raise on the left side causing calf cramping with S1 radiculitis and back pain and calf cramping on the right side to a lesser extent than left. Her strength to the lower extremities appears to be well-preserved with intact sensation good muscle tone and bulk.  Assessment & Plan:   Maryalice was seen today for back pain.  Diagnoses and all orders for this visit:  Cervicalgia -     dexamethasone (DECADRON) injection 4 mg; 1 mL (4 mg total) by Other route once.  Sciatica associated with disorder of lumbar spine, right -     Lumbar Epidural Injection -     triamcinolone acetonide (KENALOG-40) injection 40 mg; 1 mL (40 mg total) by Other route once. -     sodium chloride flush (NS) 0.9 % injection 10 mL; 10 mLs by Other route once. -     ropivacaine (PF) 2 mg/ml (0.2%) (NAROPIN) epidural 10 mL; 10 mLs by Epidural route once. -     lidocaine (PF) (XYLOCAINE) 1 % injection 5 mL; Inject 5 mLs into the skin once. -     midazolam (VERSED) injection 5 mg; Inject 5 mLs (5 mg total) into the vein once. -     lactated ringers infusion 1,000 mL; Inject 1,000 mLs into the vein continuous. -     Lumbar Epidural Injection; Future  Sciatica associated with disorder of lumbar spine, left -     Lumbar Epidural Injection  Cervical radiculitis  DDD (degenerative disc disease), cervical  Other orders -     oxyCODONE-acetaminophen (ROXICET) 5-325 MG tablet; Take 1 tablet by mouth every 6 (six) hours as needed for severe pain. -     sodium chloride 0.9 % injection;  -     ropivacaine (PF) 2 mg/ml (0.2%) (NAROPIN) 2 MG/ML epidural;  -     iopamidol (ISOVUE-M) 41 % intrathecal injection;       ----------------------------------------------------------------------------------------------------------------------  Problem List Items Addressed This Visit    None    Visit Diagnoses    Cervicalgia    -  Primary   Relevant Medications   dexamethasone (DECADRON) injection 4 mg (Completed)   Sciatica associated with disorder of lumbar spine, right       Relevant Medications   triamcinolone acetonide (KENALOG-40) injection 40 mg (Completed)   sodium chloride flush (NS) 0.9 % injection 10 mL   ropivacaine (PF) 2 mg/ml (0.2%) (NAROPIN) epidural 10 mL (Completed)   lidocaine (PF) (XYLOCAINE) 1 % injection 5 mL (Completed)   midazolam (VERSED) injection 5 mg   lactated ringers infusion 1,000 mL   Other Relevant Orders   Lumbar Epidural Injection   Sciatica associated with disorder of lumbar spine, left       Relevant Medications   midazolam (VERSED) injection 5 mg   Cervical radiculitis       Relevant Medications   midazolam (VERSED) injection  5 mg   DDD (degenerative disc disease), cervical       Relevant Medications   oxyCODONE-acetaminophen (ROXICET) 5-325 MG tablet   triamcinolone acetonide (KENALOG-40) injection 40 mg (Completed)   dexamethasone (DECADRON) injection 4 mg (Completed)      ----------------------------------------------------------------------------------------------------------------------  1. CervicalgiaWe'll proceed with bilateral trigger point injections to the mid body trapezius as discussed with her today. The risks and benefits of an once again reviewed  2. DDD (degenerative disc disease), cervical Continue follow-up with Dr. Danielle DessElsner  3. Facet arthritis of cervical region (HCC)  3. Cervical radiculitis continue doing stretching strengthening exercises as per physical therapy and as prescribed by her neurosurgeon. Return to clinic in 1 month and continue current medication regimen. We will wean her medications as noted with her to the 5 mg 4  times a day dosing with an effort to discontinue within the next few months. Continue with physical therapy exercises with trigger point injections today  4. Lumbar DDD: We will plan on an epidural steroid injection at today's visit. The risks and benefits of the procedure have been reviewed and full detail all questions answered. We will also proceed with refill of her medication management today and a hopeful reduction in total dosing at her next visit in a month. Return to clinic in 1 month for repeat lumbar epidural steroid injection for her L5-S1 bilateral lumbar radiculitis   ----------------------------------------------------------------------------------------------------------------------  I am having Ms. Hetzer maintain her clonazePAM, escitalopram, losartan, albuterol, amLODipine, methocarbamol, and oxyCODONE-acetaminophen. We administered triamcinolone acetonide, ropivacaine (PF) 2 mg/ml (0.2%), lidocaine (PF), dexamethasone, sodium chloride, ropivacaine (PF) 2 mg/ml (0.2%), and iopamidol. We will continue to administer triamcinolone acetonide, ropivacaine (PF) 2 mg/ml (0.2%), sodium chloride flush, midazolam, and lactated ringers.   Meds ordered this encounter  Medications  . oxyCODONE-acetaminophen (ROXICET) 5-325 MG tablet    Sig: Take 1 tablet by mouth every 6 (six) hours as needed for severe pain.    Dispense:  120 tablet    Refill:  0    Do not fill until [id number]  . triamcinolone acetonide (KENALOG-40) injection 40 mg  . sodium chloride flush (NS) 0.9 % injection 10 mL  . ropivacaine (PF) 2 mg/ml (0.2%) (NAROPIN) epidural 10 mL  . lidocaine (PF) (XYLOCAINE) 1 % injection 5 mL  . midazolam (VERSED) injection 5 mg  . lactated ringers infusion 1,000 mL  . dexamethasone (DECADRON) injection 4 mg  . sodium chloride 0.9 % injection    Vivia EwingWheatley, Dena: cabinet override  . ropivacaine (PF) 2 mg/ml (0.2%) (NAROPIN) 2 MG/ML epidural    Vivia EwingWheatley, Dena: cabinet override  .  iopamidol (ISOVUE-M) 41 % intrathecal injection    Vivia EwingWheatley, Dena: cabinet override       Follow-up: Return for evaluation, procedure.   Procedure: Trigger point injection: After mentioned trigger points in the right And left trapezius were prepped with alcohol and injected  5 mg of Decadron with 5 cc ropivacaine 0.2% mixed  in a fanlike distribution via a 25-gauge needle. This was well-tolerated and she was convalesced discharged to home in stable condition for follow-up as mentioned.  Procedure: L4-5 LESI with fluoroscopic guidance and moderate sedation  NOTE: The risks, benefits, and expectations of the procedure have been discussed and explained to the patient who was understanding and in agreement with suggested treatment plan. No guarantees were made.  DESCRIPTION OF PROCEDURE: Lumbar epidural steroid injection with IV Versed, EKG, blood pressure, pulse, and pulse oximetry monitoring. The procedure was performed with the patient in the  prone position under fluoroscopic guidance. A local anesthetic skin wheal of 1.5% plain lidocaine was performed at the appropriate site after fluoroscopic identifictation  Using strict aseptic technique, I then advanced an 18-gauge Tuohy epidural needle in the midline via loss-of-resistance to saline technique. There was negative aspiration for heme or  CSF.  I then confirmed position with both AP and Lateral fluoroscan. At L4-5  A total of 5 mL of Preservative-Free normal saline mixed with 40 mg of Kenalog and 1cc Ropicaine 0.2 percent was injected incrementally via the  epidurally placed needle. The needle was removed. The patient tolerated the injection well and was convalesced and discharged to home in stable condition. Should the patient have any post procedure difficulty they have been instructed on how to contact us for assistance.    Yevette Edwards, MD

## 2016-06-15 NOTE — Telephone Encounter (Signed)
Patient called to let nurses know she has redness like a sunburn and small headache, and just soreness at injection site, just in case she did not answer when they called.

## 2016-06-15 NOTE — Telephone Encounter (Signed)
Voicemail left with patient to call us back with additional information re; headache.

## 2016-06-16 NOTE — Telephone Encounter (Signed)
States headache is gone today.

## 2016-06-28 ENCOUNTER — Ambulatory Visit: Payer: 59 | Admitting: Pain Medicine

## 2016-07-04 ENCOUNTER — Ambulatory Visit: Payer: 59 | Admitting: Pain Medicine

## 2016-07-19 ENCOUNTER — Ambulatory Visit (HOSPITAL_BASED_OUTPATIENT_CLINIC_OR_DEPARTMENT_OTHER): Payer: 59 | Admitting: Anesthesiology

## 2016-07-19 ENCOUNTER — Ambulatory Visit
Admission: RE | Admit: 2016-07-19 | Discharge: 2016-07-19 | Disposition: A | Discharge status: Home / Self Care | Payer: 59 | Source: Ambulatory Visit | Attending: Anesthesiology | Admitting: Anesthesiology

## 2016-07-19 ENCOUNTER — Other Ambulatory Visit: Payer: Self-pay | Admitting: Anesthesiology

## 2016-07-19 ENCOUNTER — Encounter: Payer: Self-pay | Admitting: Anesthesiology

## 2016-07-19 VITALS — BP 149/95 | HR 61 | Temp 97.4°F | Resp 14 | Ht 62.0 in | Wt 119.0 lb

## 2016-07-19 DIAGNOSIS — R52 Pain, unspecified: Secondary | ICD-10-CM

## 2016-07-19 DIAGNOSIS — M5010 Cervical disc disorder with radiculopathy, unspecified cervical region: Secondary | ICD-10-CM | POA: Insufficient documentation

## 2016-07-19 DIAGNOSIS — M5432 Sciatica, left side: Secondary | ICD-10-CM | POA: Diagnosis not present

## 2016-07-19 DIAGNOSIS — Z90710 Acquired absence of both cervix and uterus: Secondary | ICD-10-CM | POA: Insufficient documentation

## 2016-07-19 DIAGNOSIS — F419 Anxiety disorder, unspecified: Secondary | ICD-10-CM | POA: Insufficient documentation

## 2016-07-19 DIAGNOSIS — M545 Low back pain: Secondary | ICD-10-CM | POA: Diagnosis present

## 2016-07-19 DIAGNOSIS — M542 Cervicalgia: Secondary | ICD-10-CM

## 2016-07-19 DIAGNOSIS — I10 Essential (primary) hypertension: Secondary | ICD-10-CM | POA: Insufficient documentation

## 2016-07-19 DIAGNOSIS — M5136 Other intervertebral disc degeneration, lumbar region: Secondary | ICD-10-CM | POA: Diagnosis not present

## 2016-07-19 DIAGNOSIS — M5431 Sciatica, right side: Secondary | ICD-10-CM

## 2016-07-19 DIAGNOSIS — Z9889 Other specified postprocedural states: Secondary | ICD-10-CM | POA: Diagnosis not present

## 2016-07-19 DIAGNOSIS — F17210 Nicotine dependence, cigarettes, uncomplicated: Secondary | ICD-10-CM | POA: Insufficient documentation

## 2016-07-19 MED ORDER — TRIAMCINOLONE ACETONIDE 40 MG/ML IJ SUSP
INTRAMUSCULAR | Status: AC
  Administered 2016-07-19: 15:00:00
  Filled 2016-07-19: qty 1

## 2016-07-19 MED ORDER — CYCLOBENZAPRINE HCL 10 MG PO TABS: 10.0000 mg | ORAL_TABLET | Freq: Every day | ORAL | 2 refills | Status: AC

## 2016-07-19 MED ORDER — OXYCODONE-ACETAMINOPHEN 5-325 MG PO TABS: 1.0000 | ORAL_TABLET | Freq: Four times a day (QID) | ORAL | 0 refills | Status: DC | PRN

## 2016-07-19 MED ORDER — SODIUM CHLORIDE 0.9% FLUSH
10.0000 mL | Freq: Once | INTRAVENOUS | Status: AC
  Administered 2016-07-19: 10 mL

## 2016-07-19 MED ORDER — ROPIVACAINE HCL 2 MG/ML IJ SOLN
INTRAMUSCULAR | Status: AC
  Administered 2016-07-19: 15:00:00
  Filled 2016-07-19: qty 10

## 2016-07-19 MED ORDER — TRIAMCINOLONE ACETONIDE 40 MG/ML IJ SUSP
40.0000 mg | Freq: Once | INTRAMUSCULAR | Status: AC
  Administered 2016-07-19: 40 mg
  Filled 2016-07-19: qty 1

## 2016-07-19 MED ORDER — ROPIVACAINE HCL 2 MG/ML IJ SOLN
10.0000 mL | Freq: Once | INTRAMUSCULAR | Status: AC
  Administered 2016-07-19: 10 mL via EPIDURAL
  Filled 2016-07-19: qty 10

## 2016-07-19 MED ORDER — LIDOCAINE HCL (PF) 1 % IJ SOLN
5.0000 mL | Freq: Once | INTRAMUSCULAR | Status: AC
  Administered 2016-07-19: 5 mL via SUBCUTANEOUS
  Filled 2016-07-19: qty 5

## 2016-07-19 MED ORDER — DEXAMETHASONE SODIUM PHOSPHATE 10 MG/ML IJ SOLN
10.0000 mg | Freq: Once | INTRAMUSCULAR | Status: AC
  Administered 2016-07-19: 10 mg
  Filled 2016-07-19: qty 1

## 2016-07-19 MED ORDER — SODIUM CHLORIDE 0.9 % IJ SOLN
INTRAMUSCULAR | Status: AC
  Administered 2016-07-19: 15:00:00
  Filled 2016-07-19: qty 10

## 2016-07-19 NOTE — Progress Notes (Signed)
Safety precautions to be maintained throughout the outpatient stay will include: orient to surroundings, keep bed in low position, maintain call bell within reach at all times, provide assistance with transfer out of bed and ambulation.  Med refill and procedure

## 2016-07-19 NOTE — Patient Instructions (Signed)
Pain Management Discharge Instructions  General Discharge Instructions :  If you need to reach your doctor call: Monday-Friday 8:00 am - 4:00 pm at [phone] or toll free [phone].  After clinic hours [phone] to have operator reach doctor.  Bring all of your medication bottles to all your appointments in the pain clinic.  To cancel or reschedule your appointment with Pain Management please remember to call 24 hours in advance to avoid a fee.  Refer to the educational materials which you have been given on: General Risks, I had my Procedure. Discharge Instructions, Post Sedation.  Post Procedure Instructions:  The drugs you were given will stay in your system until tomorrow, so for the next 24 hours you should not drive, make any legal decisions or drink any alcoholic beverages.  You may eat anything you prefer, but it is better to start with liquids then soups and crackers, and gradually work up to solid foods.  Please notify your doctor immediately if you have any unusual bleeding, trouble breathing or pain that is not related to your normal pain.  Depending on the type of procedure that was done, some parts of your body may feel week and/or numb.  This usually clears up by tonight or the next day.  Walk with the use of an assistive device or accompanied by an adult for the 24 hours.  You may use ice on the affected area for the first 24 hours.  Put ice in a Ziploc bag and cover with a towel and place against area 15 minutes on 15 minutes off.  You may switch to heat after 24 hours.GENERAL RISKS AND COMPLICATIONS  What are the risk, side effects and possible complications? Generally speaking, most procedures are safe.  However, with any procedure there are risks, side effects, and the possibility of complications.  The risks and complications are dependent upon the sites that are lesioned, or the type of nerve block to be performed.  The closer the procedure is to the spine,  the more serious the risks are.  Great care is taken when placing the radio frequency needles, block needles or lesioning probes, but sometimes complications can occur. 1. Infection: Any time there is an injection through the skin, there is a risk of infection.  This is why sterile conditions are used for these blocks.  There are four possible types of infection. 1. Localized skin infection. 2. Central Nervous System Infection-This can be in the form of Meningitis, which can be deadly. 3. Epidural Infections-This can be in the form of an epidural abscess, which can cause pressure inside of the spine, causing compression of the spinal cord with subsequent paralysis. This would require an emergency surgery to decompress, and there are no guarantees that the patient would recover from the paralysis. 4. Discitis-This is an infection of the intervertebral discs.  It occurs in about 1% of discography procedures.  It is difficult to treat and it may lead to surgery.        2. Pain: the needles have to go through skin and soft tissues, will cause soreness.       3. Damage to internal structures:  The nerves to be lesioned may be near blood vessels or    other nerves which can be potentially damaged.       4. Bleeding: Bleeding is more common if the patient is taking blood thinners such as  aspirin, Coumadin, Ticiid, Plavix, etc., or if he/she have some genetic predisposition  such as   hemophilia. Bleeding into the spinal canal can cause compression of the spinal  cord with subsequent paralysis.  This would require an emergency surgery to  decompress and there are no guarantees that the patient would recover from the  paralysis.       5. Pneumothorax:  Puncturing of a lung is a possibility, every time a needle is introduced in  the area of the chest or upper back.  Pneumothorax refers to free air around the  collapsed lung(s), inside of the thoracic cavity (chest cavity).  Another two possible  complications  related to a similar event would include: Hemothorax and Chylothorax.   These are variations of the Pneumothorax, where instead of air around the collapsed  lung(s), you may have blood or chyle, respectively.       6. Spinal headaches: They may occur with any procedures in the area of the spine.       7. Persistent CSF (Cerebro-Spinal Fluid) leakage: This is a rare problem, but may occur  with prolonged intrathecal or epidural catheters either due to the formation of a fistulous  track or a dural tear.       8. Nerve damage: By working so close to the spinal cord, there is always a possibility of  nerve damage, which could be as serious as a permanent spinal cord injury with  paralysis.       9. Death:  Although rare, severe deadly allergic reactions known as "Anaphylactic  reaction" can occur to any of the medications used.      10. Worsening of the symptoms:  We can always make thing worse.  What are the chances of something like this happening? Chances of any of this occuring are extremely low.  By statistics, you have more of a chance of getting killed in a motor vehicle accident: while driving to the hospital than any of the above occurring .  Nevertheless, you should be aware that they are possibilities.  In general, it is similar to taking a shower.  Everybody knows that you can slip, hit your head and get killed.  Does that mean that you should not shower again?  Nevertheless always keep in mind that statistics do not mean anything if you happen to be on the wrong side of them.  Even if a procedure has a 1 (one) in a 1,000,000 (million) chance of going wrong, it you happen to be that one..Also, keep in mind that by statistics, you have more of a chance of having something go wrong when taking medications.  Who should not have this procedure? If you are on a blood thinning medication (e.g. Coumadin, Plavix, see list of "Blood Thinners"), or if you have an active infection going on, you should not  have the procedure.  If you are taking any blood thinners, please inform your physician.  How should I prepare for this procedure?  Do not eat or drink anything at least six hours prior to the procedure.  Bring a driver with you .  It cannot be a taxi.  Come accompanied by an adult that can drive you back, and that is strong enough to help you if your legs get weak or numb from the local anesthetic.  Take all of your medicines the morning of the procedure with just enough water to swallow them.  If you have diabetes, make sure that you are scheduled to have your procedure done first thing in the morning, whenever possible.  If you have diabetes,   take only half of your insulin dose and notify our nurse that you have done so as soon as you arrive at the clinic.  If you are diabetic, but only take blood sugar pills (oral hypoglycemic), then do not take them on the morning of your procedure.  You may take them after you have had the procedure.  Do not take aspirin or any aspirin-containing medications, at least eleven (11) days prior to the procedure.  They may prolong bleeding.  Wear loose fitting clothing that may be easy to take off and that you would not mind if it got stained with Betadine or blood.  Do not wear any jewelry or perfume  Remove any nail coloring.  It will interfere with some of our monitoring equipment.  NOTE: Remember that this is not meant to be interpreted as a complete list of all possible complications.  Unforeseen problems may occur.  BLOOD THINNERS The following drugs contain aspirin or other products, which can cause increased bleeding during surgery and should not be taken for 2 weeks prior to and 1 week after surgery.  If you should need take something for relief of minor pain, you may take acetaminophen which is found in Tylenol,m Datril, Anacin-3 and Panadol. It is not blood thinner. The products listed below are.  Do not take any of the products listed below  in addition to any listed on your instruction sheet.  A.P.C or A.P.C with Codeine Codeine Phosphate Capsules #3 Ibuprofen Ridaura  ABC compound Congesprin Imuran rimadil  Advil Cope Indocin Robaxisal  Alka-Seltzer Effervescent Pain Reliever and Antacid Coricidin or Coricidin-D  Indomethacin Rufen  Alka-Seltzer plus Cold Medicine Cosprin Ketoprofen S-A-C Tablets  Anacin Analgesic Tablets or Capsules Coumadin Korlgesic Salflex  Anacin Extra Strength Analgesic tablets or capsules CP-2 Tablets Lanoril Salicylate  Anaprox Cuprimine Capsules Levenox Salocol  Anexsia-D Dalteparin Magan Salsalate  Anodynos Darvon compound Magnesium Salicylate Sine-off  Ansaid Dasin Capsules Magsal Sodium Salicylate  Anturane Depen Capsules Marnal Soma  APF Arthritis pain formula Dewitt's Pills Measurin Stanback  Argesic Dia-Gesic Meclofenamic Sulfinpyrazone  Arthritis Bayer Timed Release Aspirin Diclofenac Meclomen Sulindac  Arthritis pain formula Anacin Dicumarol Medipren Supac  Analgesic (Safety coated) Arthralgen Diffunasal Mefanamic Suprofen  Arthritis Strength Bufferin Dihydrocodeine Mepro Compound Suprol  Arthropan liquid Dopirydamole Methcarbomol with Aspirin Synalgos  ASA tablets/Enseals Disalcid Micrainin Tagament  Ascriptin Doan's Midol Talwin  Ascriptin A/D Dolene Mobidin Tanderil  Ascriptin Extra Strength Dolobid Moblgesic Ticlid  Ascriptin with Codeine Doloprin or Doloprin with Codeine Momentum Tolectin  Asperbuf Duoprin Mono-gesic Trendar  Aspergum Duradyne Motrin or Motrin IB Triminicin  Aspirin plain, buffered or enteric coated Durasal Myochrisine Trigesic  Aspirin Suppositories Easprin Nalfon Trillsate  Aspirin with Codeine Ecotrin Regular or Extra Strength Naprosyn Uracel  Atromid-S Efficin Naproxen Ursinus  Auranofin Capsules Elmiron Neocylate Vanquish  Axotal Emagrin Norgesic Verin  Azathioprine Empirin or Empirin with Codeine Normiflo Vitamin E  Azolid Emprazil Nuprin Voltaren  Bayer  Aspirin plain, buffered or children's or timed BC Tablets or powders Encaprin Orgaran Warfarin Sodium  Buff-a-Comp Enoxaparin Orudis Zorpin  Buff-a-Comp with Codeine Equegesic Os-Cal-Gesic   Buffaprin Excedrin plain, buffered or Extra Strength Oxalid   Bufferin Arthritis Strength Feldene Oxphenbutazone   Bufferin plain or Extra Strength Feldene Capsules Oxycodone with Aspirin   Bufferin with Codeine Fenoprofen Fenoprofen Pabalate or Pabalate-SF   Buffets II Flogesic Panagesic   Buffinol plain or Extra Strength Florinal or Florinal with Codeine Panwarfarin   Buf-Tabs Flurbiprofen Penicillamine   Butalbital Compound Four-way cold tablets   Penicillin   Butazolidin Fragmin Pepto-Bismol   Carbenicillin Geminisyn Percodan   Carna Arthritis Reliever Geopen Persantine   Carprofen Gold's salt Persistin   Chloramphenicol Goody's Phenylbutazone   Chloromycetin Haltrain Piroxlcam   Clmetidine heparin Plaquenil   Cllnoril Hyco-pap Ponstel   Clofibrate Hydroxy chloroquine Propoxyphen         Before stopping any of these medications, be sure to consult the physician who ordered them.  Some, such as Coumadin (Warfarin) are ordered to prevent or treat serious conditions such as "deep thrombosis", "pumonary embolisms", and other heart problems.  The amount of time that you may need off of the medication may also vary with the medication and the reason for which you were taking it.  If you are taking any of these medications, please make sure you notify your pain physician before you undergo any procedures.         Epidural Steroid Injection An epidural steroid injection is given to relieve pain in your neck, back, or legs that is caused by the irritation or swelling of a nerve root. This procedure involves injecting a steroid and numbing medicine (anesthetic) into the epidural space. The epidural space is the space between the outer covering of your spinal cord and the bones that form your backbone  (vertebra).  LET YOUR HEALTH CARE PROVIDER KNOW ABOUT:   Any allergies you have.  All medicines you are taking, including vitamins, herbs, eye drops, creams, and over-the-counter medicines such as aspirin.  Previous problems you or members of your family have had with the use of anesthetics.  Any blood disorders or blood clotting disorders you have.  Previous surgeries you have had.  Medical conditions you have. RISKS AND COMPLICATIONS Generally, this is a safe procedure. However, as with any procedure, complications can occur. Possible complications of epidural steroid injection include:  Headache.  Bleeding.  Infection.  Allergic reaction to the medicines.  Damage to your nerves. The response to this procedure depends on the underlying cause of the pain and its duration. People who have long-term (chronic) pain are less likely to benefit from epidural steroids than are those people whose pain comes on strong and suddenly. BEFORE THE PROCEDURE   Ask your health care provider about changing or stopping your regular medicines. You may be advised to stop taking blood-thinning medicines a few days before the procedure.  You may be given medicines to reduce anxiety.  Arrange for someone to take you home after the procedure. PROCEDURE   You will remain awake during the procedure. You may receive medicine to make you relaxed.  You will be asked to lie on your stomach.  The injection site will be cleaned.  The injection site will be numbed with a medicine (local anesthetic).  A needle will be injected through your skin into the epidural space.  Your health care provider will use an X-ray machine to ensure that the steroid is delivered closest to the affected nerve. You may have minimal discomfort at this time.  Once the needle is in the right position, the local anesthetic and the steroid will be injected into the epidural space.  The needle will then be removed and a  bandage will be applied to the injection site. AFTER THE PROCEDURE   You may be monitored for a short time before you go home.  You may feel weakness or numbness in your arm or leg, which disappears within hours.  You may be allowed to eat, drink, and take your regular   medicine.  You may have soreness at the site of the injection.   This information is not intended to replace advice given to you by your health care provider. Make sure you discuss any questions you have with your health care provider.   Document Released: 01/17/2008 Document Revised: 06/12/2013 Document Reviewed: 03/29/2013 [employer] Interactive Patient Education 2016 [employer]  

## 2016-07-20 NOTE — Progress Notes (Signed)
Subjective:  Patient ID: Holly Wilkinson, female    DOB: April 27, 1971  Age: 45 y.o. MRN: [id number]  CC: Back Pain (mid lower)  Procedure:L4-5 epidural steroid #2 under fluoroscopic guidance with moderate sedation and Trigger point injection #2 1 To the left caudal portion of the trapezius mid body   HPI Holly Wilkinson presents for reevaluation today.She was last seen a month ago and continues to do well in regards to her recent ACD at 2 levels. She is taking her medications including Percocet 10 mg 3 times a day and this is well tolerated. She is having some burning and spasming in the right and left proximal trapezius region but her upper extremity strength and function has been well preserved. This pain is worse with activity and has failed to improve with medication management or stretching exercises. She is requesting injection for pain relief today and reports improved function following her last trigger point injection.   Furthermore she is having worsening low back pain centralized in the low back that is now radiating down both legs and into her posterior calves. She has had previous epidural steroid injections at her last visit and reports a 30% improvement in her low back pain and a 30% improvement in the radiating pain into the left greater than right leg. She feels like she is sleeping better following the injections and her medications are more effective. Her strength is been at baseline without significant change noted there or in her bowel or bladder function. She feels that she is making good progress and desires to proceed with a repeat epidural injection today.   History Holly Wilkinson has a past medical history of Anxiety and Hypertension.   She has a past surgical history that includes Abdominal hysterectomy; Cesarean section; and Neck surgery (Right, 02/23/16).   Her family history includes Arthritis in her mother; COPD in her maternal uncle; Cancer in her mother; Diabetes in her mother;  Hypertension in her mother.She reports that she has been smoking Cigarettes.  She has been smoking about 0.50 packs per day. She has never used smokeless tobacco. She reports that she does not drink alcohol or use drugs.   ---------------------------------------------------------------------------------------------------------------------- Past Medical History:  Diagnosis Date  . Anxiety   . Hypertension     Past Surgical History:  Procedure Laterality Date  . ABDOMINAL HYSTERECTOMY    . CESAREAN SECTION    . NECK SURGERY Right 02/23/16   2 disc replaced.     Family History  Problem Relation Age of Onset  . Arthritis Mother   . Cancer Mother   . Diabetes Mother   . Hypertension Mother   . COPD Maternal Uncle     Social History  Substance Use Topics  . Smoking status: Current Every Day Smoker    Packs/day: 0.50    Types: Cigarettes  . Smokeless tobacco: Never Used  . Alcohol use No    ---------------------------------------------------------------------------------------------------------------------- Social History   Social History  . Marital status: Married    Spouse name: N/A  . Number of children: N/A  . Years of education: N/A   Social History Main Topics  . Smoking status: Current Every Day Smoker    Packs/day: 0.50    Types: Cigarettes  . Smokeless tobacco: Never Used  . Alcohol use No  . Drug use: No  . Sexual activity: Not Asked   Other Topics Concern  . None   Social History Narrative  . None      ----------------------------------------------------------------------------------------------------------------------  ROS Review of  Systems  Cardiac: High blood pressure Pulmonary: Smoker Neurologic: Anxiety Psychologic: Negative other than anxiety GI: Negative  Objective:  BP (!) 149/95   Pulse 61   Temp 97.4 F (36.3 C)   Resp 14   Ht 5\' 2"  (1.575 m)   Wt 119 lb (54 kg)   SpO2 97%   BMI 21.77 kg/m   Physical Exam  Patient is  a alert oriented and cooperative Good historian Extraocular muscles intact Heart is regular rate and rhythm Lungs are clear to also Clear to auscultation There is a well-Defined trigger in the proximal  left mid body trapezius which does reproduce the majority of her cervical pain Furthermore with the patient in the supine position she has a positive straight leg raise on the left side causing calf cramping with S1 radiculitis and back pain and calf cramping on the right side to a lesser extent than left. Her strength to the lower extremities appears to be well-preserved with intact sensation good muscle tone and bulk.  Assessment & Plan:   Holly Wilkinson was seen today for back pain.  Diagnoses and all orders for this visit:  Sciatica associated with disorder of lumbar spine, left -     Lumbar Epidural Injection; Future  Sciatica associated with disorder of lumbar spine, right -     Lumbar Epidural Injection -     Lumbar Epidural Injection; Future -     triamcinolone acetonide (KENALOG-40) injection 40 mg; 1 mL (40 mg total) by Other route once. -     sodium chloride flush (NS) 0.9 % injection 10 mL; 10 mLs by Other route once. -     ropivacaine (PF) 2 mg/ml (0.2%) (NAROPIN) epidural 10 mL; 10 mLs by Epidural route once. -     lidocaine (PF) (XYLOCAINE) 1 % injection 5 mL; Inject 5 mLs into the skin once.  Cervicalgia -     Lumbar Epidural Injection; Future -     dexamethasone (DECADRON) injection 10 mg; 1 mL (10 mg total) by Other route once.  Other orders -     Discontinue: oxyCODONE-acetaminophen (ROXICET) 5-325 MG tablet; Take 1 tablet by mouth every 6 (six) hours as needed for severe pain. -     oxyCODONE-acetaminophen (ROXICET) 5-325 MG tablet; Take 1 tablet by mouth every 6 (six) hours as needed for severe pain. -     cyclobenzaprine (FLEXERIL) 10 MG tablet; Take 1 tablet (10 mg total) by mouth at bedtime. -     ropivacaine (PF) 2 mg/ml (0.2%) (NAROPIN) 2 MG/ML epidural;  -      sodium chloride 0.9 % injection;  -     triamcinolone acetonide (KENALOG-40) 40 MG/ML injection;      ----------------------------------------------------------------------------------------------------------------------  Problem List Items Addressed This Visit    None    Visit Diagnoses    Sciatica associated with disorder of lumbar spine, left    -  Primary   Relevant Medications   cyclobenzaprine (FLEXERIL) 10 MG tablet   Other Relevant Orders   Lumbar Epidural Injection   Sciatica associated with disorder of lumbar spine, right       Relevant Medications   triamcinolone acetonide (KENALOG-40) injection 40 mg (Completed)   sodium chloride flush (NS) 0.9 % injection 10 mL (Completed)   ropivacaine (PF) 2 mg/ml (0.2%) (NAROPIN) epidural 10 mL (Completed)   lidocaine (PF) (XYLOCAINE) 1 % injection 5 mL (Completed)   cyclobenzaprine (FLEXERIL) 10 MG tablet   Other Relevant Orders   Lumbar Epidural Injection  Cervicalgia       Relevant Medications   dexamethasone (DECADRON) injection 10 mg (Completed)   Other Relevant Orders   Lumbar Epidural Injection      ----------------------------------------------------------------------------------------------------------------------  1. CervicalgiaWe'll proceed with  trigger point injections to the mid body trapeziusLeft side as discussed with her today. The risks and benefits of an once again reviewed  2. DDD (degenerative disc disease), cervical Continue follow-up with Dr. Danielle Dess And continue doing her stretching strengthening exercises.  3. Facet arthritis of cervical region (HCC)  3. Cervical radiculitis continue doing stretching strengthening exercises as per physical therapy and as prescribed by her neurosurgeon. Return to clinic in 1 month and continue current medication regimen. We will wean her medications as noted with her to the 5 mg 4 times a day dosing with an effort to discontinue within the next few months. Continue  with physical therapy exercises with trigger point injections today  4. Lumbar DDD: We will plan on an epidural steroid #2 injection at today's visit. The risks and benefits of the procedure have been reviewed and full detail all questions answered. We will also proceed with refill of her medication management today and a hopeful reduction in total dosing at her next visit in a month. Return to clinic in 1 month for repeat lumbar epidural steroid injection for her L5-S1 bilateral lumbar radiculitis   ----------------------------------------------------------------------------------------------------------------------  I am having Holly Wilkinson start on cyclobenzaprine. I am also having her maintain her clonazePAM, escitalopram, losartan, albuterol, amLODipine, methocarbamol, and oxyCODONE-acetaminophen. We administered lactated ringers, triamcinolone acetonide, sodium chloride flush, ropivacaine (PF) 2 mg/ml (0.2%), lidocaine (PF), dexamethasone, ropivacaine (PF) 2 mg/ml (0.2%), sodium chloride, and triamcinolone acetonide. We will continue to administer triamcinolone acetonide, ropivacaine (PF) 2 mg/ml (0.2%), sodium chloride flush, midazolam, and lactated ringers.   Meds ordered this encounter  Medications  . triamcinolone acetonide (KENALOG-40) injection 40 mg  . sodium chloride flush (NS) 0.9 % injection 10 mL  . ropivacaine (PF) 2 mg/ml (0.2%) (NAROPIN) epidural 10 mL  . lidocaine (PF) (XYLOCAINE) 1 % injection 5 mL  . dexamethasone (DECADRON) injection 10 mg  . DISCONTD: oxyCODONE-acetaminophen (ROXICET) 5-325 MG tablet    Sig: Take 1 tablet by mouth every 6 (six) hours as needed for severe pain.    Dispense:  120 tablet    Refill:  0    Do not fill until [id number]  . oxyCODONE-acetaminophen (ROXICET) 5-325 MG tablet    Sig: Take 1 tablet by mouth every 6 (six) hours as needed for severe pain.    Dispense:  120 tablet    Refill:  0    Do not fill until [id number]  . cyclobenzaprine  (FLEXERIL) 10 MG tablet    Sig: Take 1 tablet (10 mg total) by mouth at bedtime.    Dispense:  30 tablet    Refill:  2  . ropivacaine (PF) 2 mg/ml (0.2%) (NAROPIN) 2 MG/ML epidural    TICE, KORI: cabinet override  . sodium chloride 0.9 % injection    TICE, KORI: cabinet override  . triamcinolone acetonide (KENALOG-40) 40 MG/ML injection    TICE, KORI: cabinet override       Follow-up: Return for evaluation, procedure.   Procedure: Trigger point injection: The trigger point in the  left trapezius was prepped with alcohol and injected with 40 mg of triamcinolone and 8 cc ropivacaine 0.2% mixed  in a fanlike distribution via a 25-gauge needle. This was well-tolerated and she was convalesced discharged to home in stable condition  for follow-up as mentioned.    Procedure: L4-5 LESI with fluoroscopic guidance without sedation per patient request  NOTE: The risks, benefits, and expectations of the procedure have been discussed and explained to the patient who was understanding and in agreement with suggested treatment plan. No guarantees were made.  DESCRIPTION OF PROCEDURE: Lumbar epidural steroid injection EKG, blood pressure, pulse, and pulse oximetry monitoring. The procedure was performed with the patient in the prone position under fluoroscopic guidance. A local anesthetic skin wheal of 1.5% plain lidocaine was performed at the appropriate site overlying the L4-5 interspace. after fluoroscopic identifictation  Using strict aseptic technique, I then advanced an 18-gauge Tuohy epidural needle in the midline via loss-of-resistance to saline technique. There was negative aspiration for heme or  CSF.  I then confirmed position with both AP and Lateral fluoroscan. At L4-5  A total of 5 mL of Preservative-Free normal saline mixed with 40 mg of Kenalog and 1cc Ropicaine 0.2 percent was injected incrementally via the  epidurally placed needle. The needle was removed. The patient tolerated the injection  well and was convalesced and discharged to home in stable condition. Should the patient have any post procedure difficulty they have been instructed on how to contact us for assistance.    Yevette Edwards, MD

## 2016-08-15 ENCOUNTER — Telehealth: Payer: Self-pay

## 2016-08-15 NOTE — Telephone Encounter (Signed)
She has an extreme family emergency and has to leave to go to [place]. She wants to get her meds filled Monday. They are due to be filled 10-26 but she needs to leave asap for [place]. Can you let [facility] know that she can fill early? You can call her and leave her a message letting her know if she can do this.

## 2016-08-15 NOTE — Telephone Encounter (Signed)
Called to pharmacy and approved patient to have her medicine filled early d/t family emergency.  [facility] will do this, patient made aware.

## 2016-08-24 ENCOUNTER — Telehealth: Payer: Self-pay | Admitting: *Deleted

## 2016-08-24 ENCOUNTER — Other Ambulatory Visit: Payer: Self-pay | Admitting: Internal Medicine

## 2016-08-24 DIAGNOSIS — Z1231 Encounter for screening mammogram for malignant neoplasm of breast: Secondary | ICD-10-CM

## 2016-09-08 ENCOUNTER — Ambulatory Visit: Payer: 59 | Attending: Anesthesiology | Admitting: Anesthesiology

## 2016-09-08 ENCOUNTER — Encounter: Payer: Self-pay | Admitting: Anesthesiology

## 2016-09-08 VITALS — BP 147/94 | HR 87 | Temp 98.4°F | Resp 16 | Ht 62.0 in | Wt 116.0 lb

## 2016-09-08 DIAGNOSIS — M5442 Lumbago with sciatica, left side: Secondary | ICD-10-CM | POA: Diagnosis not present

## 2016-09-08 DIAGNOSIS — M7918 Myalgia, other site: Secondary | ICD-10-CM

## 2016-09-08 DIAGNOSIS — M25512 Pain in left shoulder: Secondary | ICD-10-CM | POA: Insufficient documentation

## 2016-09-08 DIAGNOSIS — F419 Anxiety disorder, unspecified: Secondary | ICD-10-CM | POA: Insufficient documentation

## 2016-09-08 DIAGNOSIS — M5412 Radiculopathy, cervical region: Secondary | ICD-10-CM | POA: Diagnosis not present

## 2016-09-08 DIAGNOSIS — M791 Myalgia: Secondary | ICD-10-CM | POA: Diagnosis not present

## 2016-09-08 DIAGNOSIS — M5030 Other cervical disc degeneration, unspecified cervical region: Secondary | ICD-10-CM | POA: Diagnosis not present

## 2016-09-08 DIAGNOSIS — M5136 Other intervertebral disc degeneration, lumbar region: Secondary | ICD-10-CM | POA: Diagnosis not present

## 2016-09-08 DIAGNOSIS — M539 Dorsopathy, unspecified: Secondary | ICD-10-CM

## 2016-09-08 DIAGNOSIS — M5441 Lumbago with sciatica, right side: Secondary | ICD-10-CM | POA: Insufficient documentation

## 2016-09-08 DIAGNOSIS — M5386 Other specified dorsopathies, lumbar region: Secondary | ICD-10-CM

## 2016-09-08 DIAGNOSIS — I10 Essential (primary) hypertension: Secondary | ICD-10-CM | POA: Diagnosis not present

## 2016-09-08 DIAGNOSIS — G629 Polyneuropathy, unspecified: Secondary | ICD-10-CM | POA: Diagnosis not present

## 2016-09-08 DIAGNOSIS — M545 Low back pain: Secondary | ICD-10-CM | POA: Diagnosis present

## 2016-09-08 DIAGNOSIS — M542 Cervicalgia: Secondary | ICD-10-CM | POA: Diagnosis not present

## 2016-09-08 DIAGNOSIS — F17210 Nicotine dependence, cigarettes, uncomplicated: Secondary | ICD-10-CM | POA: Insufficient documentation

## 2016-09-08 DIAGNOSIS — M4682 Other specified inflammatory spondylopathies, cervical region: Secondary | ICD-10-CM | POA: Diagnosis not present

## 2016-09-08 MED ORDER — GABAPENTIN 300 MG PO CAPS: 300.0000 mg | ORAL_CAPSULE | Freq: Two times a day (BID) | ORAL | 1 refills | Status: AC

## 2016-09-08 MED ORDER — DEXAMETHASONE SODIUM PHOSPHATE 10 MG/ML IJ SOLN
INTRAMUSCULAR | Status: AC
  Administered 2016-09-08: 16:00:00
  Filled 2016-09-08: qty 1

## 2016-09-08 MED ORDER — OXYCODONE-ACETAMINOPHEN 5-325 MG PO TABS: 2.0000 | ORAL_TABLET | Freq: Three times a day (TID) | ORAL | 0 refills | Status: AC | PRN

## 2016-09-08 MED ORDER — OXYCODONE-ACETAMINOPHEN 5-325 MG PO TABS: 1.0000 | ORAL_TABLET | Freq: Four times a day (QID) | ORAL | 0 refills | Status: DC | PRN

## 2016-09-08 MED ORDER — ROPIVACAINE HCL 2 MG/ML IJ SOLN
INTRAMUSCULAR | Status: AC
  Administered 2016-09-08: 16:00:00
  Filled 2016-09-08: qty 10

## 2016-09-08 NOTE — Patient Instructions (Signed)
GENERAL RISKS AND COMPLICATIONS  What are the risk, side effects and possible complications? Generally speaking, most procedures are safe.  However, with any procedure there are risks, side effects, and the possibility of complications.  The risks and complications are dependent upon the sites that are lesioned, or the type of nerve block to be performed.  The closer the procedure is to the spine, the more serious the risks are.  Great care is taken when placing the radio frequency needles, block needles or lesioning probes, but sometimes complications can occur. 1. Infection: Any time there is an injection through the skin, there is a risk of infection.  This is why sterile conditions are used for these blocks.  There are four possible types of infection. 1. Localized skin infection. 2. Central Nervous System Infection-This can be in the form of Meningitis, which can be deadly. 3. Epidural Infections-This can be in the form of an epidural abscess, which can cause pressure inside of the spine, causing compression of the spinal cord with subsequent paralysis. This would require an emergency surgery to decompress, and there are no guarantees that the patient would recover from the paralysis. 4. Discitis-This is an infection of the intervertebral discs.  It occurs in about 1% of discography procedures.  It is difficult to treat and it may lead to surgery.        2. Pain: the needles have to go through skin and soft tissues, will cause soreness.       3. Damage to internal structures:  The nerves to be lesioned may be near blood vessels or    other nerves which can be potentially damaged.       4. Bleeding: Bleeding is more common if the patient is taking blood thinners such as  aspirin, Coumadin, Ticiid, Plavix, etc., or if he/she have some genetic predisposition  such as hemophilia. Bleeding into the spinal canal can cause compression of the spinal  cord with subsequent paralysis.  This would require an  emergency surgery to  decompress and there are no guarantees that the patient would recover from the  paralysis.       5. Pneumothorax:  Puncturing of a lung is a possibility, every time a needle is introduced in  the area of the chest or upper back.  Pneumothorax refers to free air around the  collapsed lung(s), inside of the thoracic cavity (chest cavity).  Another two possible  complications related to a similar event would include: Hemothorax and Chylothorax.   These are variations of the Pneumothorax, where instead of air around the collapsed  lung(s), you may have blood or chyle, respectively.       6. Spinal headaches: They may occur with any procedures in the area of the spine.       7. Persistent CSF (Cerebro-Spinal Fluid) leakage: This is a rare problem, but may occur  with prolonged intrathecal or epidural catheters either due to the formation of a fistulous  track or a dural tear.       8. Nerve damage: By working so close to the spinal cord, there is always a possibility of  nerve damage, which could be as serious as a permanent spinal cord injury with  paralysis.       9. Death:  Although rare, severe deadly allergic reactions known as "Anaphylactic  reaction" can occur to any of the medications used.      10. Worsening of the symptoms:  We can always make thing worse.    What are the chances of something like this happening? Chances of any of this occuring are extremely low.  By statistics, you have more of a chance of getting killed in a motor vehicle accident: while driving to the hospital than any of the above occurring .  Nevertheless, you should be aware that they are possibilities.  In general, it is similar to taking a shower.  Everybody knows that you can slip, hit your head and get killed.  Does that mean that you should not shower again?  Nevertheless always keep in mind that statistics do not mean anything if you happen to be on the wrong side of them.  Even if a procedure has a 1  (one) in a 1,000,000 (million) chance of going wrong, it you happen to be that one..Also, keep in mind that by statistics, you have more of a chance of having something go wrong when taking medications.  Who should not have this procedure? If you are on a blood thinning medication (e.g. Coumadin, Plavix, see list of "Blood Thinners"), or if you have an active infection going on, you should not have the procedure.  If you are taking any blood thinners, please inform your physician.  How should I prepare for this procedure?  Do not eat or drink anything at least six hours prior to the procedure.  Bring a driver with you .  It cannot be a taxi.  Come accompanied by an adult that can drive you back, and that is strong enough to help you if your legs get weak or numb from the local anesthetic.  Take all of your medicines the morning of the procedure with just enough water to swallow them.  If you have diabetes, make sure that you are scheduled to have your procedure done first thing in the morning, whenever possible.  If you have diabetes, take only half of your insulin dose and notify our nurse that you have done so as soon as you arrive at the clinic.  If you are diabetic, but only take blood sugar pills (oral hypoglycemic), then do not take them on the morning of your procedure.  You may take them after you have had the procedure.  Do not take aspirin or any aspirin-containing medications, at least eleven (11) days prior to the procedure.  They may prolong bleeding.  Wear loose fitting clothing that may be easy to take off and that you would not mind if it got stained with Betadine or blood.  Do not wear any jewelry or perfume  Remove any nail coloring.  It will interfere with some of our monitoring equipment.  NOTE: Remember that this is not meant to be interpreted as a complete list of all possible complications.  Unforeseen problems may occur.  BLOOD THINNERS The following drugs  contain aspirin or other products, which can cause increased bleeding during surgery and should not be taken for 2 weeks prior to and 1 week after surgery.  If you should need take something for relief of minor pain, you may take acetaminophen which is found in Tylenol,m Datril, Anacin-3 and Panadol. It is not blood thinner. The products listed below are.  Do not take any of the products listed below in addition to any listed on your instruction sheet.  A.P.C or A.P.C with Codeine Codeine Phosphate Capsules #3 Ibuprofen Ridaura  ABC compound Congesprin Imuran rimadil  Advil Cope Indocin Robaxisal  Alka-Seltzer Effervescent Pain Reliever and Antacid Coricidin or Coricidin-D  Indomethacin Rufen    Alka-Seltzer plus Cold Medicine Cosprin Ketoprofen S-A-C Tablets  Anacin Analgesic Tablets or Capsules Coumadin Korlgesic Salflex  Anacin Extra Strength Analgesic tablets or capsules CP-2 Tablets Lanoril Salicylate  Anaprox Cuprimine Capsules Levenox Salocol  Anexsia-D Dalteparin Magan Salsalate  Anodynos Darvon compound Magnesium Salicylate Sine-off  Ansaid Dasin Capsules Magsal Sodium Salicylate  Anturane Depen Capsules Marnal Soma  APF Arthritis pain formula Dewitt's Pills Measurin Stanback  Argesic Dia-Gesic Meclofenamic Sulfinpyrazone  Arthritis Bayer Timed Release Aspirin Diclofenac Meclomen Sulindac  Arthritis pain formula Anacin Dicumarol Medipren Supac  Analgesic (Safety coated) Arthralgen Diffunasal Mefanamic Suprofen  Arthritis Strength Bufferin Dihydrocodeine Mepro Compound Suprol  Arthropan liquid Dopirydamole Methcarbomol with Aspirin Synalgos  ASA tablets/Enseals Disalcid Micrainin Tagament  Ascriptin Doan's Midol Talwin  Ascriptin A/D Dolene Mobidin Tanderil  Ascriptin Extra Strength Dolobid Moblgesic Ticlid  Ascriptin with Codeine Doloprin or Doloprin with Codeine Momentum Tolectin  Asperbuf Duoprin Mono-gesic Trendar  Aspergum Duradyne Motrin or Motrin IB Triminicin  Aspirin  plain, buffered or enteric coated Durasal Myochrisine Trigesic  Aspirin Suppositories Easprin Nalfon Trillsate  Aspirin with Codeine Ecotrin Regular or Extra Strength Naprosyn Uracel  Atromid-S Efficin Naproxen Ursinus  Auranofin Capsules Elmiron Neocylate Vanquish  Axotal Emagrin Norgesic Verin  Azathioprine Empirin or Empirin with Codeine Normiflo Vitamin E  Azolid Emprazil Nuprin Voltaren  Bayer Aspirin plain, buffered or children's or timed BC Tablets or powders Encaprin Orgaran Warfarin Sodium  Buff-a-Comp Enoxaparin Orudis Zorpin  Buff-a-Comp with Codeine Equegesic Os-Cal-Gesic   Buffaprin Excedrin plain, buffered or Extra Strength Oxalid   Bufferin Arthritis Strength Feldene Oxphenbutazone   Bufferin plain or Extra Strength Feldene Capsules Oxycodone with Aspirin   Bufferin with Codeine Fenoprofen Fenoprofen Pabalate or Pabalate-SF   Buffets II Flogesic Panagesic   Buffinol plain or Extra Strength Florinal or Florinal with Codeine Panwarfarin   Buf-Tabs Flurbiprofen Penicillamine   Butalbital Compound Four-way cold tablets Penicillin   Butazolidin Fragmin Pepto-Bismol   Carbenicillin Geminisyn Percodan   Carna Arthritis Reliever Geopen Persantine   Carprofen Gold's salt Persistin   Chloramphenicol Goody's Phenylbutazone   Chloromycetin Haltrain Piroxlcam   Clmetidine heparin Plaquenil   Cllnoril Hyco-pap Ponstel   Clofibrate Hydroxy chloroquine Propoxyphen         Before stopping any of these medications, be sure to consult the physician who ordered them.  Some, such as Coumadin (Warfarin) are ordered to prevent or treat serious conditions such as "deep thrombosis", "pumonary embolisms", and other heart problems.  The amount of time that you may need off of the medication may also vary with the medication and the reason for which you were taking it.  If you are taking any of these medications, please make sure you notify your pain physician before you undergo any  procedures.         Epidural Steroid Injection Patient Information  Description: The epidural space surrounds the nerves as they exit the spinal cord.  In some patients, the nerves can be compressed and inflamed by a bulging disc or a tight spinal canal (spinal stenosis).  By injecting steroids into the epidural space, we can bring irritated nerves into direct contact with a potentially helpful medication.  These steroids act directly on the irritated nerves and can reduce swelling and inflammation which often leads to decreased pain.  Epidural steroids may be injected anywhere along the spine and from the neck to the low back depending upon the location of your pain.   After numbing the skin with local anesthetic (like Novocaine), a small needle is passed   into the epidural space slowly.  You may experience a sensation of pressure while this is being done.  The entire block usually last less than 10 minutes.  Conditions which may be treated by epidural steroids:   Low back and leg pain  Neck and arm pain  Spinal stenosis  Post-laminectomy syndrome  Herpes zoster (shingles) pain  Pain from compression fractures  Preparation for the injection:  1. Do not eat any solid food or dairy products within 8 hours of your appointment.  2. You may drink clear liquids up to 3 hours before appointment.  Clear liquids include water, black coffee, juice or soda.  No milk or cream please. 3. You may take your regular medication, including pain medications, with a sip of water before your appointment  Diabetics should hold regular insulin (if taken separately) and take 1/2 normal NPH dos the morning of the procedure.  Carry some sugar containing items with you to your appointment. 4. A driver must accompany you and be prepared to drive you home after your procedure.  5. Bring all your current medications with your. 6. An IV may be inserted and sedation may be given at the discretion of the  physician.   7. A blood pressure cuff, EKG and other monitors will often be applied during the procedure.  Some patients may need to have extra oxygen administered for a short period. 8. You will be asked to provide medical information, including your allergies, prior to the procedure.  We must know immediately if you are taking blood thinners (like Coumadin/Warfarin)  Or if you are allergic to IV iodine contrast (dye). We must know if you could possible be pregnant.  Possible side-effects:  Bleeding from needle site  Infection (rare, may require surgery)  Nerve injury (rare)  Numbness & tingling (temporary)  Difficulty urinating (rare, temporary)  Spinal headache ( a headache worse with upright posture)  Light -headedness (temporary)  Pain at injection site (several days)  Decreased blood pressure (temporary)  Weakness in arm/leg (temporary)  Pressure sensation in back/neck (temporary)  Call if you experience:  Fever/chills associated with headache or increased back/neck pain.  Headache worsened by an upright position.  New onset weakness or numbness of an extremity below the injection site  Hives or difficulty breathing (go to the emergency room)  Inflammation or drainage at the infection site  Severe back/neck pain  Any new symptoms which are concerning to you  Please note:  Although the local anesthetic injected can often make your back or neck feel good for several hours after the injection, the pain will likely return.  It takes 3-7 days for steroids to work in the epidural space.  You may not notice any pain relief for at least that one week.  If effective, we will often do a series of three injections spaced 3-6 weeks apart to maximally decrease your pain.  After the initial series, we generally will wait several months before considering a repeat injection of the same type.  If you have any questions, please call [phone] [facility] 

## 2016-09-08 NOTE — Progress Notes (Signed)
Safety precautions to be maintained throughout the outpatient stay will include: orient to surroundings, keep bed in low position, maintain call bell within reach at all times, provide assistance with transfer out of bed and ambulation.  

## 2016-09-12 ENCOUNTER — Telehealth: Payer: Self-pay | Admitting: Anesthesiology

## 2016-09-12 NOTE — Telephone Encounter (Signed)
Pharmacy informed ok to fill today

## 2016-09-12 NOTE — Telephone Encounter (Signed)
Patient notified that meds can be filled 2 days early, but they must last until 10-14-16 as scheduled. Patient verbalized understanding.

## 2016-09-12 NOTE — Telephone Encounter (Signed)
Does not have enough meds to last until 11-22 due to dosing changes made by dr Pernell Dupreadams, [facility] but will need someone to call in ok to fill

## 2016-09-12 NOTE — Progress Notes (Signed)
Subjective:  Patient ID: Holly Wilkinson, female    DOB: April 09, 1971  Age: 45 y.o. MRN: [id number]  CC: Back Pain (left, lower) and Pain (entire left side of the body)  Procedure:Left trapezius trigger point injection 2 Previous procedure: L4-5 epidural steroid September and August under fluoroscopic guidance with moderate sedation and Trigger point injection   HPI Holly Wilkinson presents for reevaluation today.she has had 2 previous epidural steroid injections and her low back pain is improving with diminished lower extremity pain. Today her primary complaint is in regards to her left shoulder pain. She has had worsening neck and left shoulder pain with radiation into the hand. She has been evaluated by Dr. Marcello FennelHande and has a history of neuropathy. She is also being evaluated by hematology. In regards to her neck and arm pain she has reported a significant exacerbation in her left shoulder and neck pain. He is still being followed by Dr. Danielle DessElsner.    History Holly Wilkinson has a past medical history of Anxiety and Hypertension.   She has a past surgical history that includes Abdominal hysterectomy; Cesarean section; and Neck surgery (Right, 02/23/16).   Her family history includes Arthritis in her mother; COPD in her maternal uncle; Cancer in her mother; Diabetes in her mother; Hypertension in her mother.She reports that she has been smoking Cigarettes.  She has been smoking about 0.50 packs per day. She has never used smokeless tobacco. She reports that she does not drink alcohol or use drugs.   ---------------------------------------------------------------------------------------------------------------------- Past Medical History:  Diagnosis Date  . Anxiety   . Hypertension     Past Surgical History:  Procedure Laterality Date  . ABDOMINAL HYSTERECTOMY    . CESAREAN SECTION    . NECK SURGERY Right 02/23/16   2 disc replaced.     Family History  Problem Relation Age of Onset  . Arthritis  Mother   . Cancer Mother   . Diabetes Mother   . Hypertension Mother   . COPD Maternal Uncle     Social History  Substance Use Topics  . Smoking status: Current Every Day Smoker    Packs/day: 0.50    Types: Cigarettes  . Smokeless tobacco: Never Used  . Alcohol use No    ---------------------------------------------------------------------------------------------------------------------- Social History   Social History  . Marital status: Married    Spouse name: N/A  . Number of children: N/A  . Years of education: N/A   Social History Main Topics  . Smoking status: Current Every Day Smoker    Packs/day: 0.50    Types: Cigarettes  . Smokeless tobacco: Never Used  . Alcohol use No  . Drug use: No  . Sexual activity: Not Asked   Other Topics Concern  . None   Social History Narrative  . None      ----------------------------------------------------------------------------------------------------------------------  ROS Review of Systems  No changes in review of systems are reported Objective:  BP (!) 147/94   Pulse 87   Temp 98.4 F (36.9 C) (Oral)   Resp 16   Ht 5\' 2"  (1.575 m)   Wt 116 lb (52.6 kg)   SpO2 100%   BMI 21.22 kg/m   Physical Exam  Patient is a alert oriented and cooperative Good historian Extraocular muscles intact Heart is regular rate and rhythm Lungs are clear to also Clear to auscultation There is a well-Defined trigger in the proximal  left mid body trapezius which does reproduce the majority of her cervical pain   Assessment & Plan:  Holly Wilkinson was seen today for back pain and pain.  Diagnoses and all orders for this visit:  Muscle pain, myofacial -     TRIGGER POINT INJECTION  Sciatica of right side associated with disorder of lumbar spine -     Lumbar Epidural Injection -     ToxASSURE Select 13 (MW), Urine  Sciatica of left side associated with disorder of lumbar spine -     Lumbar Epidural Injection -     ToxASSURE  Select 13 (MW), Urine  Cervicalgia -     Lumbar Epidural Injection -     ToxASSURE Select 13 (MW), Urine -     TRIGGER POINT INJECTION -     TRIGGER POINT INJECTION  Myofacial muscle pain -     TRIGGER POINT INJECTION  Other orders -     ropivacaine (PF) 2 mg/ml (0.2%) (NAROPIN) 2 MG/ML epidural;  -     dexamethasone (DECADRON) 10 MG/ML injection;  -     Discontinue: oxyCODONE-acetaminophen (ROXICET) 5-325 MG tablet; Take 1 tablet by mouth every 6 (six) hours as needed for severe pain. -     gabapentin (NEURONTIN) 300 MG capsule; Take 1 capsule (300 mg total) by mouth 2 (two) times daily. -     Discontinue: oxyCODONE-acetaminophen (ROXICET) 5-325 MG tablet; Take 1 tablet by mouth every 6 (six) hours as needed for severe pain. -     oxyCODONE-acetaminophen (ROXICET) 5-325 MG tablet; Take 2 tablets by mouth every 8 (eight) hours as needed for severe pain.     ----------------------------------------------------------------------------------------------------------------------  Problem List Items Addressed This Visit    None    Visit Diagnoses    Muscle pain, myofacial    -  Primary   Relevant Orders   TRIGGER POINT INJECTION   Sciatica of right side associated with disorder of lumbar spine       Relevant Medications   gabapentin (NEURONTIN) 300 MG capsule   Other Relevant Orders   ToxASSURE Select 13 (MW), Urine   Sciatica of left side associated with disorder of lumbar spine       Relevant Medications   gabapentin (NEURONTIN) 300 MG capsule   Other Relevant Orders   ToxASSURE Select 13 (MW), Urine   Cervicalgia       Relevant Orders   ToxASSURE Select 13 (MW), Urine   TRIGGER POINT INJECTION   Myofacial muscle pain          ----------------------------------------------------------------------------------------------------------------------  1. Cervicalgia  We'll proceed with  trigger point injections to the mid body trapezius left side as discussed with her today.  The risks and benefits of an once again reviewed. I have encouraged her to continue with her physical therapy exercises and continue follow-up with Dr. Danielle DessElsner regarding the persistent exacerbation in her left arm pain. She has reported good relief with the trigger point injections in this regard in the past.  2. DDD (degenerative disc disease), cervical   3. Facet arthritis of cervical region (HCC)  3. Cervical radiculitis continue doing stretching strengthening exercises as per physical therapy and as prescribed by her neurosurgeon. Secondary to the recent exacerbation in her pain I'm going to allow her to increase to a 2 tablet 3 times a day dosing regimen and this will be for 1 month at which point we will back down to our baseline at 4 times a day scheduling. I'm also going to start her on a baseline dose of Neurontin twice a day to see if this can help with some  of the upper extremity neuralgia that she is experiencing  4. Lumbar DDD: We'll have her return to clinic in 1 month for an epidural steroid at that time area and she is to continue with her back stretching strengthening exercises.   ----------------------------------------------------------------------------------------------------------------------  I have discontinued Ms. Ezra's oxyCODONE-acetaminophen and oxyCODONE-acetaminophen. I have also changed her oxyCODONE-acetaminophen. Additionally, I am having her start on gabapentin. Lastly, I am having her maintain her clonazePAM, escitalopram, losartan, albuterol, amLODipine, methocarbamol, cyclobenzaprine, and methylPREDNISolone. We administered ropivacaine (PF) 2 mg/ml (0.2%) and dexamethasone. We will continue to administer triamcinolone acetonide, ropivacaine (PF) 2 mg/ml (0.2%), sodium chloride flush, midazolam, and lactated ringers.   Meds ordered this encounter  Medications  . methylPREDNISolone (MEDROL DOSEPAK) 4 MG TBPK tablet    Sig: Take by mouth.  . ropivacaine (PF) 2  mg/ml (0.2%) (NAROPIN) 2 MG/ML epidural    Jarold Motto, Delores: cabinet override  . dexamethasone (DECADRON) 10 MG/ML injection    Jarold Motto, Delores: cabinet override  . DISCONTD: oxyCODONE-acetaminophen (ROXICET) 5-325 MG tablet    Sig: Take 1 tablet by mouth every 6 (six) hours as needed for severe pain.    Dispense:  120 tablet    Refill:  0    Do not fill until [id number]  . gabapentin (NEURONTIN) 300 MG capsule    Sig: Take 1 capsule (300 mg total) by mouth 2 (two) times daily.    Dispense:  60 capsule    Refill:  1  . DISCONTD: oxyCODONE-acetaminophen (ROXICET) 5-325 MG tablet    Sig: Take 1 tablet by mouth every 6 (six) hours as needed for severe pain.    Dispense:  180 tablet    Refill:  0    Do not fill until [id number]  . oxyCODONE-acetaminophen (ROXICET) 5-325 MG tablet    Sig: Take 2 tablets by mouth every 8 (eight) hours as needed for severe pain.    Dispense:  180 tablet    Refill:  0    Do not fill until [id number]       Follow-up: Return for procedure, evaluation.   Procedure: Trigger point injection: The trigger point in the  left trapezius was prepped with alcohol and injected with Decadron 10 mg and 8 cc ropivacaine 0.2% mixed  in a fanlike distribution via a 25-gauge needle. This was well-tolerated and she was convalesced discharged to home in stable condition for follow-up as mentioned.    Yevette Edwards, MD

## 2016-09-17 LAB — TOXASSURE SELECT 13 (MW), URINE

## 2016-09-20 ENCOUNTER — Other Ambulatory Visit: Payer: Self-pay | Admitting: Hematology and Oncology

## 2016-09-20 NOTE — Progress Notes (Signed)
[facility] CONSULT NOTE  Patient Care Team: Tracie Harrier, MD as PCP - General (Internal Medicine)  CHIEF COMPLAINTS/PURPOSE OF CONSULTATION:  Chronic leukocytosis  HISTORY OF PRESENTING ILLNESS:  Holly Wilkinson 45 y.o. female is here because of elevated WBC.  She was found to have abnormal CBC from routine blood work monitoring. I had the opportunity to review his CBC dated from 04/08/2014 to 09/01/2016. The total white blood cell count has ranged from 11.2 to as high as 13.5. The pattern of leukocytosis is predominantly neutrophilia She denies recent infection. The last prescription antibiotics was more than 3 months ago There is not reported symptoms of sinus congestion, cough, urinary frequency/urgency or dysuria, diarrhea, or abnormal skin rash.  She reported chronic, diffuse joint pain throughout. She had multiple steroid injections without meaningful benefit She was started on some prednisone prescription recently with no relief She complained of intermittent blurriness of vision, confusion episodes and severe headaches. She have anorexia and claims to have lost 30 pounds of weight even though her weight appears to be stable. The patient had history of early cervical cancer many years ago. That was treated with abdominal hysterectomy but she never received adjuvant treatment  The patient is a smoker and currently smokes 1/2 pack of cigarettes per day for the last 20+ years.  MEDICAL HISTORY:  Past Medical History:  Diagnosis Date  . Anxiety   . Hypertension     SURGICAL HISTORY: Past Surgical History:  Procedure Laterality Date  . ABDOMINAL HYSTERECTOMY    . CESAREAN SECTION    . NECK SURGERY Right 02/23/16   2 disc replaced.     SOCIAL HISTORY: Social History   Social History  . Marital status: Married    Spouse name: N/A  . Number of children: N/A  . Years of education: N/A   Occupational History  . Not on file.   Social History Main Topics   . Smoking status: Current Every Day Smoker    Packs/day: 0.50    Years: 20.00    Types: Cigarettes  . Smokeless tobacco: Never Used  . Alcohol use No  . Drug use: No  . Sexual activity: Not on file   Other Topics Concern  . Not on file   Social History Narrative  . No narrative on file    FAMILY HISTORY: Family History  Problem Relation Age of Onset  . Arthritis Mother   . Cancer Mother     ?possibly liver cancer  . Diabetes Mother   . Hypertension Mother   . COPD Maternal Uncle     ALLERGIES:  is allergic to varenicline and metaxalone.  MEDICATIONS:  Current Outpatient Prescriptions  Medication Sig Dispense Refill  . albuterol (PROVENTIL HFA;VENTOLIN HFA) 108 (90 Base) MCG/ACT inhaler Inhale 2 puffs into the lungs every 6 (six) hours as needed for wheezing or shortness of breath.    Marland Kitchen amLODipine (NORVASC) 10 MG tablet Take 10 mg by mouth daily.     . clonazePAM (KLONOPIN) 1 MG tablet 1 mg 2 (two) times daily as needed.     . cyclobenzaprine (FLEXERIL) 10 MG tablet Take 1 tablet (10 mg total) by mouth at bedtime. 30 tablet 2  . escitalopram (LEXAPRO) 10 MG tablet Take by mouth daily.     Marland Kitchen gabapentin (NEURONTIN) 300 MG capsule Take 1 capsule (300 mg total) by mouth 2 (two) times daily. 60 capsule 1  . losartan (COZAAR) 25 MG tablet Take by mouth daily.     Marland Kitchen  nortriptyline (PAMELOR) 25 MG capsule Take by mouth.    . oxyCODONE-acetaminophen (ROXICET) 5-325 MG tablet Take 2 tablets by mouth every 8 (eight) hours as needed for severe pain. 180 tablet 0  . Vitamin D, Ergocalciferol, (DRISDOL) 50000 units CAPS capsule TK ONE C PO Q WEEK  5   Current Facility-Administered Medications  Medication Dose Route Frequency Provider Last Rate Last Dose  . lactated ringers infusion 1,000 mL  1,000 mL Intravenous Continuous Molli Barrows, MD 125 mL/hr at 07/19/16 1527 1,000 mL at 07/19/16 1527  . midazolam (VERSED) injection 5 mg  5 mg Intravenous Once Molli Barrows, MD      .  ropivacaine (PF) 2 mg/ml (0.2%) (NAROPIN) epidural 10 mL  10 mL Epidural Once Molli Barrows, MD      . sodium chloride flush (NS) 0.9 % injection 10 mL  10 mL Other Once Molli Barrows, MD      . triamcinolone acetonide (KENALOG-40) injection 40 mg  40 mg Other Once Molli Barrows, MD        REVIEW OF SYSTEMS:   Constitutional: Denies fevers, chills or abnormal night sweats Ears, nose, mouth, throat, and face: Denies mucositis or sore throat Respiratory: Denies cough, dyspnea or wheezes Cardiovascular: Denies palpitation, chest discomfort or lower extremity swelling Gastrointestinal:  Denies nausea, heartburn or change in bowel habits Skin: Denies abnormal skin rashes Lymphatics: Denies new lymphadenopathy or easy bruising Neurological:Denies numbness, tingling or new weaknesses Behavioral/Psych: Mood is stable, no new changes  All other systems were reviewed with the patient and are negative.  PHYSICAL EXAMINATION: ECOG PERFORMANCE STATUS: 1 - Symptomatic but completely ambulatory  Vitals:   09/21/16 0925  BP: (!) 167/98  Pulse: 85  Resp: 18  Temp: (!) 96.8 F (36 C)   Filed Weights   09/21/16 0925  Weight: 121 lb 4.1 oz (55 kg)    GENERAL:alert, no distress and comfortable SKIN: skin color, texture, turgor are normal, no rashes or significant lesions EYES: normal, conjunctiva are pink and non-injected, sclera clear OROPHARYNX:no exudate, no erythema and lips, buccal mucosa, and tongue normal  NECK: supple, thyroid normal size, non-tender, without nodularity LYMPH:  no palpable lymphadenopathy in the cervical, axillary or inguinal LUNGS: clear to auscultation and percussion with normal breathing effort HEART: regular rate & rhythm and no murmurs and no lower extremity edema ABDOMEN:abdomen soft, non-tender and normal bowel sounds Musculoskeletal:no cyanosis of digits and no clubbing  PSYCH: alert & oriented x 3 with fluent speech NEURO: no focal motor/sensory  deficits  LABORATORY DATA:  I have reviewed the data as listed Recent Results (from the past 2160 hour(s))  ToxASSURE Select 13 (MW), Urine     Status: None   Collection Time: 09/08/16  3:26 PM  Result Value Ref Range   ToxAssure Select 13 FINAL     Comment: ==================================================================== TOXASSURE SELECT 13 (MW) ==================================================================== Test                             Result       Flag       Units Drug Present and Declared for Prescription Verification   7-aminoclonazepam              277          EXPECTED   ng/mg creat    7-aminoclonazepam is an expected metabolite of clonazepam. Source    of clonazepam is a scheduled prescription medication.  Oxycodone                      3915         EXPECTED   ng/mg creat   Oxymorphone                    4992         EXPECTED   ng/mg creat   Noroxycodone                   13888        EXPECTED   ng/mg creat   Noroxymorphone                 1962         EXPECTED   ng/mg creat    Sources of oxycodone are scheduled prescription medications.    Oxymorphone, noroxycodone, and noroxymorphone are expected    metabolites of oxycodone. Oxymorphone is also available as a    scheduled prescription medication. === ================================================================= Test                      Result    Flag   Units      Ref Range   Creatinine              26               mg/dL      >=20 ==================================================================== Declared Medications:  The flagging and interpretation on this report are based on the  following declared medications.  Unexpected results may arise from  inaccuracies in the declared medications.  **Note: The testing scope of this panel includes these medications:  Clonazepam (Klonopin)  Oxycodone (Percocet)  **Note: The testing scope of this panel does not include following  reported medications:   Acetaminophen (Percocet)  Albuterol  Amlodipine (Norvasc)  Citalopram (Lexapro)  Cyclobenzaprine (Flexeril)  Losartan (Cozaar)  Methocarbamol (Robaxin)  Methylprednisolone ==================================================================== For clinical consultation, please call [phone]. =================== =================================================   CBC with Differential/Platelet     Status: Abnormal   Collection Time: 09/21/16 10:00 AM  Result Value Ref Range   WBC 13.3 (H) 3.6 - 11.0 K/uL   RBC 4.48 3.80 - 5.20 MIL/uL   Hemoglobin 14.7 12.0 - 16.0 g/dL   HCT 43.3 35.0 - 47.0 %   MCV 96.6 80.0 - 100.0 fL   MCH 32.7 26.0 - 34.0 pg   MCHC 33.9 32.0 - 36.0 g/dL   RDW 14.1 11.5 - 14.5 %   Platelets 252 150 - 440 K/uL   Neutrophils Relative % 75 %   Neutro Abs 9.9 (H) 1.4 - 6.5 K/uL   Lymphocytes Relative 18 %   Lymphs Abs 2.4 1.0 - 3.6 K/uL   Monocytes Relative 6 %   Monocytes Absolute 0.8 0.2 - 0.9 K/uL   Eosinophils Relative 1 %   Eosinophils Absolute 0.1 0 - 0.7 K/uL   Basophils Relative 0 %   Basophils Absolute 0.1 0 - 0.1 K/uL  Comprehensive metabolic panel     Status: Abnormal   Collection Time: 09/21/16 10:00 AM  Result Value Ref Range   Sodium 136 135 - 145 mmol/L   Potassium 4.3 3.5 - 5.1 mmol/L   Chloride 103 101 - 111 mmol/L   CO2 26 22 - 32 mmol/L   Glucose, Bld 90 65 - 99 mg/dL   BUN 13 6 - 20 mg/dL   Creatinine, Ser 0.66 0.44 -  1.00 mg/dL   Calcium 8.8 (L) 8.9 - 10.3 mg/dL   Total Protein 7.0 6.5 - 8.1 g/dL   Albumin 4.1 3.5 - 5.0 g/dL   AST 26 15 - 41 U/L   ALT 25 14 - 54 U/L   Alkaline Phosphatase 58 38 - 126 U/L   Total Bilirubin 0.5 0.3 - 1.2 mg/dL   GFR calc non Af Amer >60 >60 mL/min   GFR calc Af Amer >60 >60 mL/min    Comment: (NOTE) The eGFR has been calculated using the CKD EPI equation. This calculation has not been validated in all clinical situations. eGFR's persistently <60 mL/min signify possible Chronic  Kidney Disease.    Anion gap 7 5 - 15  Sedimentation rate     Status: None   Collection Time: 09/21/16 10:00 AM  Result Value Ref Range   Sed Rate 7 0 - 20 mm/hr  Urinalysis complete, with microscopic [facility])     Status: Abnormal   Collection Time: 09/21/16 10:00 AM  Result Value Ref Range   Color, Urine YELLOW (A) YELLOW   APPearance CLEAR (A) CLEAR   Glucose, UA NEGATIVE NEGATIVE mg/dL   Bilirubin Urine NEGATIVE NEGATIVE   Ketones, ur NEGATIVE NEGATIVE mg/dL   Specific Gravity, Urine 1.014 1.005 - 1.030   Hgb urine dipstick NEGATIVE NEGATIVE   pH 6.0 5.0 - 8.0   Protein, ur NEGATIVE NEGATIVE mg/dL   Nitrite NEGATIVE NEGATIVE   Leukocytes, UA NEGATIVE NEGATIVE   RBC / HPF 0-5 0 - 5 RBC/hpf   WBC, UA 0-5 0 - 5 WBC/hpf   Bacteria, UA NONE SEEN NONE SEEN   Squamous Epithelial / LPF 0-5 (A) NONE SEEN   Mucous PRESENT     ASSESSMENT & PLAN  Leukocytosis She has intermittent leukocytosis, predominantly neutrophilic picture. It is not associated with anemia or thrombocytopenia. This is likely reactive issue but due to multiple different concerns, I will order additional workup.  Chronic joint pain She has diffuse joint pains. Prior MRI imaging revealed degenerative joint diseases. I will order additional workup to exclude rheumatological causes  Weight loss, non-intentional The patient described unintentional weight loss. Her weight appears to be almost the same but she claims she has lost over 30 pounds in 1 year due to anorexia The patient is a smoker with history of early cervical cancer She had hysterectomy several years ago because of this I plan to order a CT imaging study to exclude malignancy  Headache disorder She has chronic headaches. Along with that, she has intermittent confusion episodes recently With abnormal weight loss, smoking and history of cancer, I recommend CT imaging of the head   Cigarette smoker I spent some time counseling the patient the  importance of tobacco cessation. We discussed common tobacco cessation strategies She is currently not interested to quit now.    Orders Placed This Encounter  Procedures  . Urine culture    Standing Status:   Future    Number of Occurrences:   1    Standing Expiration Date:   10/26/2017  . CT CHEST W CONTRAST    Standing Status:   Future    Standing Expiration Date:   10/26/2017    Order Specific Question:   Reason for exam:    Answer:   smoker, weight loss, cough, hx early cervical ca, s/p hysterectomy    Order Specific Question:   Is the patient pregnant?    Answer:   No    Order Specific Question:  Preferred imaging location?    Answer:   [facility]  . CT Head W Wo Contrast    Standing Status:   Future    Standing Expiration Date:   09/21/2017    Order Specific Question:   If indicated for the ordered procedure, I authorize the administration of contrast media per Radiology protocol    Answer:   Yes    Order Specific Question:   Reason for Exam (SYMPTOM  OR DIAGNOSIS REQUIRED)    Answer:   smoker, severe headaches    Order Specific Question:   Is patient pregnant?    Answer:   No    Order Specific Question:   Preferred imaging location?    Answer:   [facility]  . CT ABDOMEN PELVIS W CONTRAST    Standing Status:   Future    Standing Expiration Date:   10/26/2017    Order Specific Question:   Reason for exam:    Answer:   history of early cervical cancer s/p hysterectomy, weight loss    Order Specific Question:   Is the patient pregnant?    Answer:   No    Order Specific Question:   Preferred imaging location?    Answer:   [facility]  . CBC with Differential/Platelet    Standing Status:   Future    Number of Occurrences:   1    Standing Expiration Date:   10/26/2017  . Comprehensive metabolic panel    Standing Status:   Future    Number of Occurrences:   1    Standing Expiration Date:   10/26/2017  . Sedimentation rate    Standing Status:   Future     Number of Occurrences:   1    Standing Expiration Date:   10/26/2017  . Urinalysis, Microscopic - [facility]    Standing Status:   Future    Standing Expiration Date:   10/26/2017  . ANA w/Reflex if Positive    Standing Status:   Future    Number of Occurrences:   1    Standing Expiration Date:   09/21/2017  . Rheumatoid factor    Standing Status:   Future    Number of Occurrences:   1    Standing Expiration Date:   09/21/2017  . Vitamin D 25 hydroxy    Standing Status:   Future    Number of Occurrences:   1    Standing Expiration Date:   09/21/2017    All questions were answered. The patient knows to call the clinic with any problems, questions or concerns. I spent 40 minutes counseling the patient face to face. The total time spent in the appointment was 55 minutes and more than 50% was on counseling.     Heath Lark, MD 09/21/2016 12:47 PM

## 2016-09-21 ENCOUNTER — Inpatient Hospital Stay: Payer: 59

## 2016-09-21 ENCOUNTER — Encounter: Payer: Self-pay | Admitting: Hematology and Oncology

## 2016-09-21 ENCOUNTER — Inpatient Hospital Stay: Payer: 59 | Attending: Hematology and Oncology | Admitting: Hematology and Oncology

## 2016-09-21 DIAGNOSIS — F419 Anxiety disorder, unspecified: Secondary | ICD-10-CM | POA: Insufficient documentation

## 2016-09-21 DIAGNOSIS — F17210 Nicotine dependence, cigarettes, uncomplicated: Secondary | ICD-10-CM | POA: Diagnosis not present

## 2016-09-21 DIAGNOSIS — R519 Headache, unspecified: Secondary | ICD-10-CM

## 2016-09-21 DIAGNOSIS — R630 Anorexia: Secondary | ICD-10-CM | POA: Diagnosis not present

## 2016-09-21 DIAGNOSIS — I10 Essential (primary) hypertension: Secondary | ICD-10-CM | POA: Diagnosis not present

## 2016-09-21 DIAGNOSIS — Z79899 Other long term (current) drug therapy: Secondary | ICD-10-CM | POA: Insufficient documentation

## 2016-09-21 DIAGNOSIS — R410 Disorientation, unspecified: Secondary | ICD-10-CM | POA: Insufficient documentation

## 2016-09-21 DIAGNOSIS — G8929 Other chronic pain: Secondary | ICD-10-CM

## 2016-09-21 DIAGNOSIS — D72825 Bandemia: Secondary | ICD-10-CM

## 2016-09-21 DIAGNOSIS — Z8541 Personal history of malignant neoplasm of cervix uteri: Secondary | ICD-10-CM | POA: Diagnosis not present

## 2016-09-21 DIAGNOSIS — Z809 Family history of malignant neoplasm, unspecified: Secondary | ICD-10-CM | POA: Diagnosis not present

## 2016-09-21 DIAGNOSIS — Z90722 Acquired absence of ovaries, bilateral: Secondary | ICD-10-CM | POA: Diagnosis not present

## 2016-09-21 DIAGNOSIS — H538 Other visual disturbances: Secondary | ICD-10-CM | POA: Insufficient documentation

## 2016-09-21 DIAGNOSIS — Z90710 Acquired absence of both cervix and uterus: Secondary | ICD-10-CM

## 2016-09-21 DIAGNOSIS — R634 Abnormal weight loss: Secondary | ICD-10-CM | POA: Diagnosis not present

## 2016-09-21 DIAGNOSIS — M2550 Pain in unspecified joint: Secondary | ICD-10-CM | POA: Insufficient documentation

## 2016-09-21 DIAGNOSIS — R51 Headache: Secondary | ICD-10-CM

## 2016-09-21 DIAGNOSIS — D72829 Elevated white blood cell count, unspecified: Secondary | ICD-10-CM | POA: Diagnosis not present

## 2016-09-21 LAB — URINALYSIS COMPLETE WITH MICROSCOPIC (ARMC ONLY)
BACTERIA UA: NONE SEEN
Bilirubin Urine: NEGATIVE
GLUCOSE, UA: NEGATIVE mg/dL
Hgb urine dipstick: NEGATIVE
Ketones, ur: NEGATIVE mg/dL
Leukocytes, UA: NEGATIVE
Nitrite: NEGATIVE
PROTEIN: NEGATIVE mg/dL
Specific Gravity, Urine: 1.014 (ref 1.005–1.030)
pH: 6 (ref 5.0–8.0)

## 2016-09-21 LAB — COMPREHENSIVE METABOLIC PANEL
ALBUMIN: 4.1 g/dL (ref 3.5–5.0)
ALK PHOS: 58 U/L (ref 38–126)
ALT: 25 U/L (ref 14–54)
AST: 26 U/L (ref 15–41)
Anion gap: 7 (ref 5–15)
BILIRUBIN TOTAL: 0.5 mg/dL (ref 0.3–1.2)
BUN: 13 mg/dL (ref 6–20)
CALCIUM: 8.8 mg/dL — AB (ref 8.9–10.3)
CO2: 26 mmol/L (ref 22–32)
CREATININE: 0.66 mg/dL (ref 0.44–1.00)
Chloride: 103 mmol/L (ref 101–111)
GFR calc non Af Amer: >60 mL/min (ref >60)
GLUCOSE: 90 mg/dL (ref 65–99)
Potassium: 4.3 mmol/L (ref 3.5–5.1)
SODIUM: 136 mmol/L (ref 135–145)
TOTAL PROTEIN: 7 g/dL (ref 6.5–8.1)

## 2016-09-21 LAB — CBC WITH DIFFERENTIAL/PLATELET
Basophils Absolute: 0.1 10*3/uL (ref 0–0.1)
Basophils Relative: 0 %
EOS ABS: 0.1 10*3/uL (ref 0–0.7)
Eosinophils Relative: 1 %
HEMATOCRIT: 43.3 % (ref 35.0–47.0)
HEMOGLOBIN: 14.7 g/dL (ref 12.0–16.0)
LYMPHS ABS: 2.4 10*3/uL (ref 1.0–3.6)
Lymphocytes Relative: 18 %
MCH: 32.7 pg (ref 26.0–34.0)
MCHC: 33.9 g/dL (ref 32.0–36.0)
MCV: 96.6 fL (ref 80.0–100.0)
MONOS PCT: 6 %
Monocytes Absolute: 0.8 10*3/uL (ref 0.2–0.9)
NEUTROS ABS: 9.9 10*3/uL — AB (ref 1.4–6.5)
NEUTROS PCT: 75 %
Platelets: 252 10*3/uL (ref 150–440)
RBC: 4.48 MIL/uL (ref 3.80–5.20)
RDW: 14.1 % (ref 11.5–14.5)
WBC: 13.3 10*3/uL — AB (ref 3.6–11.0)

## 2016-09-21 LAB — SEDIMENTATION RATE: Sed Rate: 7 mm/hr (ref 0–20)

## 2016-09-21 NOTE — Assessment & Plan Note (Signed)
I spent some time counseling the patient the importance of tobacco cessation. We discussed common tobacco cessation strategies She is currently not interested to quit now.  

## 2016-09-21 NOTE — Assessment & Plan Note (Signed)
She has intermittent leukocytosis, predominantly neutrophilic picture. It is not associated with anemia or thrombocytopenia. This is likely reactive issue but due to multiple different concerns, I will order additional workup.

## 2016-09-21 NOTE — Assessment & Plan Note (Signed)
She has diffuse joint pains. Prior MRI imaging revealed degenerative joint diseases. I will order additional workup to exclude rheumatological causes

## 2016-09-21 NOTE — Assessment & Plan Note (Signed)
The patient described unintentional weight loss. Her weight appears to be almost the same but she claims she has lost over 30 pounds in 1 year due to anorexia The patient is a smoker with history of early cervical cancer She had hysterectomy several years ago because of this I plan to order a CT imaging study to exclude malignancy

## 2016-09-21 NOTE — Assessment & Plan Note (Addendum)
She has chronic headaches. Along with that, she has intermittent confusion episodes recently With abnormal weight loss, smoking and history of cancer, I recommend CT imaging of the head

## 2016-09-22 ENCOUNTER — Telehealth: Payer: Self-pay | Admitting: Hematology and Oncology

## 2016-09-22 ENCOUNTER — Other Ambulatory Visit: Payer: Self-pay | Admitting: Hematology and Oncology

## 2016-09-22 DIAGNOSIS — D8989 Other specified disorders involving the immune mechanism, not elsewhere classified: Secondary | ICD-10-CM | POA: Insufficient documentation

## 2016-09-22 LAB — URINE CULTURE

## 2016-09-22 LAB — ENA+DNA/DS+ANTICH+CENTRO+JO...
Chromatin Ab SerPl-aCnc: <0.2 AI (ref 0.0–0.9)
ENA SM Ab Ser-aCnc: <0.2 AI (ref 0.0–0.9)
RIBONUCLEIC PROTEIN: 2.3 AI — AB (ref 0.0–0.9)
SSB (La) (ENA) Antibody, IgG: <0.2 AI (ref 0.0–0.9)
Scleroderma (Scl-70) (ENA) Antibody, IgG: <0.2 AI (ref 0.0–0.9)

## 2016-09-22 LAB — RHEUMATOID FACTOR: Rhuematoid fact SerPl-aCnc: <10 IU/mL (ref 0.0–13.9)

## 2016-09-22 LAB — VITAMIN D 25 HYDROXY (VIT D DEFICIENCY, FRACTURES): Vit D, 25-Hydroxy: 43.4 ng/mL (ref 30.0–100.0)

## 2016-09-22 LAB — ANA W/REFLEX IF POSITIVE: Anti Nuclear Antibody(ANA): POSITIVE — AB

## 2016-09-22 NOTE — Telephone Encounter (Signed)
I review some of the preliminary blood work from yesterday. ANA screen came back positive. I recommend rheumatology consult and she agreed

## 2016-09-23 ENCOUNTER — Telehealth: Payer: Self-pay

## 2016-09-23 NOTE — Telephone Encounter (Signed)
-----   Message from Artis DelayNi Gorsuch, MD sent at 09/22/2016  2:46 PM EST ----- Regarding: rheumatology consult Hi Heather,  I spoke with the patient. Some of the blood test that I ordered yesterday came back abnormal. I recommend rheumatology consult and I did place an order for referral. Can you make sure that the referral sent? Thanks

## 2016-09-23 NOTE — Telephone Encounter (Signed)
Records faxed to [facility] for appt.

## 2016-09-26 ENCOUNTER — Telehealth: Payer: Self-pay | Admitting: *Deleted

## 2016-09-26 NOTE — Telephone Encounter (Signed)
Patient states she threw away remaining percOcet because she cannot tolerate the tylenol. Instructed her that she will need an appoinTment to discuss her medications. NO PHONE REFILLS AND PATIENT INSTRUCTED NOT TO EVER THROW AWAY MEDICATIONS- BRING IN FOR DISPOSAL/REPLACEMENT. Pateint to make appointment for follow up.

## 2016-09-26 NOTE — Telephone Encounter (Signed)
Patient called back at 11:15 saying that she really needs for someone to call her back asap. Her oncologist is waiting for an answer regarding her medicine.

## 2016-09-27 NOTE — Telephone Encounter (Signed)
[facility] on 09/29/2016.

## 2016-09-28 ENCOUNTER — Ambulatory Visit: Payer: 59 | Attending: Internal Medicine

## 2016-10-03 ENCOUNTER — Other Ambulatory Visit: Payer: Self-pay | Admitting: Hematology and Oncology

## 2016-10-03 DIAGNOSIS — R519 Headache, unspecified: Secondary | ICD-10-CM

## 2016-10-03 DIAGNOSIS — D8989 Other specified disorders involving the immune mechanism, not elsewhere classified: Secondary | ICD-10-CM

## 2016-10-03 DIAGNOSIS — F17210 Nicotine dependence, cigarettes, uncomplicated: Secondary | ICD-10-CM

## 2016-10-03 DIAGNOSIS — R51 Headache: Secondary | ICD-10-CM

## 2016-10-04 ENCOUNTER — Ambulatory Visit: Admission: RE | Admit: 2016-10-04 | Payer: 59 | Source: Ambulatory Visit

## 2016-10-04 ENCOUNTER — Other Ambulatory Visit: Payer: 59

## 2016-10-05 ENCOUNTER — Inpatient Hospital Stay: Payer: 59 | Admitting: Hematology and Oncology

## 2016-10-07 ENCOUNTER — Ambulatory Visit: Admission: RE | Admit: 2016-10-07 | Payer: 59 | Source: Ambulatory Visit

## 2016-10-07 ENCOUNTER — Ambulatory Visit
Admission: RE | Admit: 2016-10-07 | Discharge: 2016-10-07 | Disposition: A | Discharge status: Home / Self Care | Payer: Commercial Managed Care - HMO | Source: Ambulatory Visit | Attending: Hematology and Oncology | Admitting: Hematology and Oncology

## 2016-10-07 ENCOUNTER — Other Ambulatory Visit: Payer: Self-pay | Admitting: Hematology and Oncology

## 2016-10-07 DIAGNOSIS — R519 Headache, unspecified: Secondary | ICD-10-CM

## 2016-10-07 DIAGNOSIS — D8989 Other specified disorders involving the immune mechanism, not elsewhere classified: Secondary | ICD-10-CM

## 2016-10-07 DIAGNOSIS — F17210 Nicotine dependence, cigarettes, uncomplicated: Secondary | ICD-10-CM | POA: Insufficient documentation

## 2016-10-07 DIAGNOSIS — R51 Headache: Secondary | ICD-10-CM | POA: Diagnosis present

## 2016-10-07 DIAGNOSIS — M359 Systemic involvement of connective tissue, unspecified: Secondary | ICD-10-CM | POA: Diagnosis not present

## 2016-10-07 MED ORDER — GADOBENATE DIMEGLUMINE 529 MG/ML IV SOLN
10.0000 mL | Freq: Once | INTRAVENOUS | Status: AC | PRN
  Administered 2016-10-07: 10 mL via INTRAVENOUS

## 2016-10-10 ENCOUNTER — Ambulatory Visit
Admission: RE | Admit: 2016-10-10 | Discharge: 2016-10-10 | Disposition: A | Discharge status: Home / Self Care | Payer: Commercial Managed Care - HMO | Source: Ambulatory Visit | Attending: Hematology and Oncology | Admitting: Hematology and Oncology

## 2016-10-10 ENCOUNTER — Ambulatory Visit: Payer: 59

## 2016-10-10 DIAGNOSIS — R634 Abnormal weight loss: Secondary | ICD-10-CM | POA: Insufficient documentation

## 2016-10-10 MED ORDER — IOPAMIDOL (ISOVUE-300) INJECTION 61%
125.0000 mL | Freq: Once | INTRAVENOUS | Status: AC | PRN
  Administered 2016-10-10: 125 mL via INTRAVENOUS

## 2016-10-12 ENCOUNTER — Ambulatory Visit: Payer: 59

## 2016-10-13 ENCOUNTER — Inpatient Hospital Stay: Payer: 59 | Attending: Oncology | Admitting: Oncology

## 2016-10-13 ENCOUNTER — Encounter: Payer: Self-pay | Admitting: Oncology

## 2016-10-13 VITALS — BP 133/95 | HR 92 | Temp 95.7°F | Wt 121.1 lb

## 2016-10-13 DIAGNOSIS — F419 Anxiety disorder, unspecified: Secondary | ICD-10-CM | POA: Insufficient documentation

## 2016-10-13 DIAGNOSIS — I700 Atherosclerosis of aorta: Secondary | ICD-10-CM | POA: Diagnosis not present

## 2016-10-13 DIAGNOSIS — M2550 Pain in unspecified joint: Secondary | ICD-10-CM | POA: Diagnosis not present

## 2016-10-13 DIAGNOSIS — D72825 Bandemia: Secondary | ICD-10-CM | POA: Insufficient documentation

## 2016-10-13 DIAGNOSIS — M5137 Other intervertebral disc degeneration, lumbosacral region: Secondary | ICD-10-CM | POA: Diagnosis not present

## 2016-10-13 DIAGNOSIS — Z8541 Personal history of malignant neoplasm of cervix uteri: Secondary | ICD-10-CM | POA: Insufficient documentation

## 2016-10-13 DIAGNOSIS — Z79899 Other long term (current) drug therapy: Secondary | ICD-10-CM | POA: Diagnosis not present

## 2016-10-13 DIAGNOSIS — F17210 Nicotine dependence, cigarettes, uncomplicated: Secondary | ICD-10-CM

## 2016-10-13 DIAGNOSIS — I10 Essential (primary) hypertension: Secondary | ICD-10-CM | POA: Insufficient documentation

## 2016-10-13 NOTE — Progress Notes (Signed)
Hematology/Oncology Consult note [facility]  [phone] [phone]) [phone]  Patient Care Team: Tracie Harrier, MD as PCP - General (Internal Medicine)   Name of the patient: Holly Wilkinson  [id number]  06/19/1971   Date of visit: 10/13/16  Diagnosis- 1. Leukocytosis predominantly neutrophilia  Chief complaint/ Reason for visit- follow-up of leukocytosis  Heme/Onc history: Patient is a 45 year old female who was initially seen by Dr. Elson Areas on 09/21/2016 for evaluation of leukocytosis. At that time she noted the patient's white count ranges from 11-13 and is predominantly neutrophilic. Looking back at her counts patient had a normal WBC count in June 2015. Since July 2015 her white count has been between 11-13. She did additional workup including ANA which was positive. He is DNA antibody was normal and ribonuclease protein was mildly elevated at 2.3. The rest of the vasculitis workup was negative. Rheumatoid factor was within normal limits and ESR was normal. Vitamin D levels were within normal limits. CMP was within normal limits except for a mildly low calcium of 8.8. CT abdomen on 10/10/2016 showed no acute findings or evidence of malignancy. Incidental findings of aortic atherosclerosis and bilateral L5 pars defects with grade 2 anterolisthesis and degenerative disc disease. Patient was also complaining of some headaches at that time and an MRI of the brain was done which was negative. Patient expressed concern of 30 pound weight loss over one year although we appear unchanged. Systemic scans were ordered to exclude malignancy and CT abdomen was negative CT chest has been ordered but not done yet  Interval history- continues to have joint pains and is currently following up with rheumatology. Denies any recurrent infections or fevers.    Review of systems- Review of Systems  Constitutional: Negative for chills, fever, malaise/fatigue and weight  loss.  HENT: Negative for congestion, ear discharge and nosebleeds.   Eyes: Negative for blurred vision.  Respiratory: Negative for cough, hemoptysis, sputum production, shortness of breath and wheezing.   Cardiovascular: Negative for chest pain, palpitations, orthopnea and claudication.  Gastrointestinal: Negative for abdominal pain, blood in stool, constipation, diarrhea, heartburn, melena, nausea and vomiting.  Genitourinary: Negative for dysuria, flank pain, frequency, hematuria and urgency.  Musculoskeletal: Positive for joint pain. Negative for back pain and myalgias.  Skin: Negative for rash.  Neurological: Negative for dizziness, tingling, focal weakness, seizures, weakness and headaches.  Endo/Heme/Allergies: Does not bruise/bleed easily.  Psychiatric/Behavioral: Negative for depression and suicidal ideas. The patient does not have insomnia.      Current treatment- observation  Allergies  Allergen Reactions  . Varenicline Nausea Only  . Metaxalone Rash     Past Medical History:  Diagnosis Date  . Anxiety   . Hypertension      Past Surgical History:  Procedure Laterality Date  . ABDOMINAL HYSTERECTOMY    . CESAREAN SECTION    . NECK SURGERY Right 02/23/16   2 disc replaced.     Social History   Social History  . Marital status: Married    Spouse name: N/A  . Number of children: N/A  . Years of education: N/A   Occupational History  . Not on file.   Social History Main Topics  . Smoking status: Current Every Day Smoker    Packs/day: 0.50    Years: 20.00    Types: Cigarettes  . Smokeless tobacco: Never Used  . Alcohol use No  . Drug use: No  . Sexual activity: Not on file   Other Topics Concern  .  Not on file   Social History Narrative  . No narrative on file    Family History  Problem Relation Age of Onset  . Arthritis Mother   . Cancer Mother     ?possibly liver cancer  . Diabetes Mother   . Hypertension Mother   . COPD Maternal Uncle        Current Outpatient Prescriptions:  .  albuterol (PROVENTIL HFA;VENTOLIN HFA) 108 (90 Base) MCG/ACT inhaler, Inhale 2 puffs into the lungs every 6 (six) hours as needed for wheezing or shortness of breath., Disp: , Rfl:  .  amLODipine (NORVASC) 10 MG tablet, Take 10 mg by mouth daily. , Disp: , Rfl:  .  clonazePAM (KLONOPIN) 1 MG tablet, 1 mg 2 (two) times daily as needed. , Disp: , Rfl:  .  cyclobenzaprine (FLEXERIL) 10 MG tablet, Take 1 tablet (10 mg total) by mouth at bedtime., Disp: 30 tablet, Rfl: 2 .  escitalopram (LEXAPRO) 10 MG tablet, Take by mouth daily. , Disp: , Rfl:  .  gabapentin (NEURONTIN) 300 MG capsule, Take 1 capsule (300 mg total) by mouth 2 (two) times daily., Disp: 60 capsule, Rfl: 1 .  losartan (COZAAR) 25 MG tablet, Take by mouth daily. , Disp: , Rfl:  .  nortriptyline (PAMELOR) 25 MG capsule, Take by mouth., Disp: , Rfl:  .  oxyCODONE-acetaminophen (ROXICET) 5-325 MG tablet, Take 2 tablets by mouth every 8 (eight) hours as needed for severe pain., Disp: 180 tablet, Rfl: 0 .  Vitamin D, Ergocalciferol, (DRISDOL) 50000 units CAPS capsule, TK ONE C PO Q WEEK, Disp: , Rfl: 5  Current Facility-Administered Medications:  .  lactated ringers infusion 1,000 mL, 1,000 mL, Intravenous, Continuous, Molli Barrows, MD, 1,000 mL at 07/19/16 1527 .  midazolam (VERSED) injection 5 mg, 5 mg, Intravenous, Once, Molli Barrows, MD .  ropivacaine (PF) 2 mg/ml (0.2%) (NAROPIN) epidural 10 mL, 10 mL, Epidural, Once, Molli Barrows, MD .  sodium chloride flush (NS) 0.9 % injection 10 mL, 10 mL, Other, Once, Molli Barrows, MD .  triamcinolone acetonide (KENALOG-40) injection 40 mg, 40 mg, Other, Once, Molli Barrows, MD  Physical exam:  Vitals:   10/13/16 0850  BP: (!) 133/95  Pulse: 92  Temp: (!) 95.7 F (35.4 C)  TempSrc: Tympanic  Weight: 121 lb 2.3 oz (54.9 kg)   Physical Exam  Constitutional: She is oriented to person, place, and time and well-developed, well-nourished, and in  no distress.  HENT:  Head: Normocephalic and atraumatic.  Eyes: EOM are normal. Pupils are equal, round, and reactive to light.  Neck: Normal range of motion.  Cardiovascular: Normal rate, regular rhythm and normal heart sounds.   Pulmonary/Chest: Effort normal and breath sounds normal.  Abdominal: Soft. Bowel sounds are normal.  Neurological: She is alert and oriented to person, place, and time.  Skin: Skin is warm and dry.     CMP Latest Ref Rng & Units 09/21/2016  Glucose 65 - 99 mg/dL 90  BUN 6 - 20 mg/dL 13  Creatinine 0.44 - 1.00 mg/dL 0.66  Sodium 135 - 145 mmol/L 136  Potassium 3.5 - 5.1 mmol/L 4.3  Chloride 101 - 111 mmol/L 103  CO2 22 - 32 mmol/L 26  Calcium 8.9 - 10.3 mg/dL 8.8(L)  Total Protein 6.5 - 8.1 g/dL 7.0  Total Bilirubin 0.3 - 1.2 mg/dL 0.5  Alkaline Phos 38 - 126 U/L 58  AST 15 - 41 U/L 26  ALT 14 - 54  U/L 25   CBC Latest Ref Rng & Units 09/21/2016  WBC 3.6 - 11.0 K/uL 13.3(H)  Hemoglobin 12.0 - 16.0 g/dL 14.7  Hematocrit 35.0 - 47.0 % 43.3  Platelets 150 - 440 K/uL 252     Mr Brain W Wo Contrast  Result Date: 10/07/2016 CLINICAL DATA:  Autoimmune disorder. Headache. Dizziness. Balance issues. Blurred and double vision. Weight loss. Personal history of cervical cancer. EXAM: MRI HEAD WITHOUT AND WITH CONTRAST TECHNIQUE: Multiplanar, multiecho pulse sequences of the brain and surrounding structures were obtained without and with intravenous contrast. CONTRAST:  58m MULTIHANCE GADOBENATE DIMEGLUMINE 529 MG/ML IV SOLN COMPARISON:  MRI brain 12/04/2013. FINDINGS: Brain: No acute infarct, hemorrhage, or mass lesion is present. The ventricles are of normal size. No significant extraaxial fluid collection is present. Vascular: Flow is present in the major intracranial arteries. Skull and upper cervical spine: Skullbase is within normal limits. Midline sagittal structures are unremarkable. Cervical spine fusion is noted at the C3 level. Craniocervical junction is  within normal limits. Sinuses/Orbits: The paranasal sinuses and mastoid air cells are clear. The globes and orbits are within normal limits. IMPRESSION: Negative MRI of the brain. Electronically Signed   By: CSan MorelleM.D.   On: 10/07/2016 11:25   Ct Abdomen Pelvis W Contrast  Result Date: 10/11/2016 CLINICAL DATA:  None unintentional weight loss. Leukocytosis. Personal history of cervical carcinoma. EXAM: CT ABDOMEN AND PELVIS WITH CONTRAST TECHNIQUE: Multidetector CT imaging of the abdomen and pelvis was performed using the standard protocol following bolus administration of intravenous contrast. CONTRAST:  1250mISOVUE-300 IOPAMIDOL (ISOVUE-300) INJECTION 61% COMPARISON:  09/20/2011 FINDINGS: Lower Chest: No acute findings. Hepatobiliary:  No masses identified. Gallbladder is unremarkable. Pancreas:  No mass or inflammatory changes. Spleen: Within normal limits in size and appearance. Adrenals/Urinary Tract: No masses identified. Tiny sub-cm right renal cyst. No evidence of hydronephrosis. Stomach/Bowel: No evidence of obstruction, inflammatory process or abnormal fluid collections. Normal appendix visualized. Vascular/Lymphatic: No pathologically enlarged lymph nodes. No abdominal aortic aneurysm. Aortic atherosclerosis. Reproductive: Prior hysterectomy noted. Adnexal regions are unremarkable in appearance. Other:  None. Musculoskeletal: No suspicious bone lesions identified. Bilateral L5 spondylolysis with degenerative disc disease with grade 2 anterolisthesis at L5-S1 measuring approximately 8 mm. IMPRESSION: No acute findings. No evidence of recurrent or metastatic carcinoma. 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 Electronically Signed   By: JoEarle Gell.D.   On: 10/11/2016 08:21     Assessment and plan- Patient is a [age].o. female was initially seen by [place] evaluation of  leukocytosis  1. Neutrophilic leukocytosis- given that her leukocytosis is mild and predominantly neutrophilic; at this time I would monitor her every 6 months without any additional workup at this time. This is likely reactive and could also be associated with her smoking  2.   joint pain - given her ongoing arthralgias as well as a positive ANA and she follows up with Dr. BoMeda Coffeeor the same. ANA can be false positive but patient has had steroid injections for her joint pain in the past without any significant benefit   3. I reviewed the results of the CT abdomen with the patient which did not reveal any evidence of malignancy. MRI of her head was negative. CT thorax has not been done yet but we will reorder that in follow-up. Prior CT chest in 2010 did not reveal any acute pathology  4. Return to clinic in  6 months with CBC and differential    Visit Diagnosis 1. Bandemia      Dr. Randa Evens, MD, MPH [place] at [facility] Pager-  10/13/2016 7:54 AM

## 2016-10-14 ENCOUNTER — Ambulatory Visit
Admission: RE | Admit: 2016-10-14 | Discharge: 2016-10-14 | Disposition: A | Discharge status: Home / Self Care | Payer: Commercial Managed Care - HMO | Source: Ambulatory Visit | Attending: Hematology and Oncology | Admitting: Hematology and Oncology

## 2016-10-14 ENCOUNTER — Ambulatory Visit: Payer: 59 | Admitting: Anesthesiology

## 2016-10-14 DIAGNOSIS — I2510 Atherosclerotic heart disease of native coronary artery without angina pectoris: Secondary | ICD-10-CM | POA: Insufficient documentation

## 2016-10-14 DIAGNOSIS — R05 Cough: Secondary | ICD-10-CM | POA: Diagnosis not present

## 2016-10-14 DIAGNOSIS — I700 Atherosclerosis of aorta: Secondary | ICD-10-CM | POA: Diagnosis not present

## 2016-10-14 DIAGNOSIS — R634 Abnormal weight loss: Secondary | ICD-10-CM | POA: Diagnosis not present

## 2016-10-14 DIAGNOSIS — F17210 Nicotine dependence, cigarettes, uncomplicated: Secondary | ICD-10-CM

## 2016-10-14 DIAGNOSIS — J439 Emphysema, unspecified: Secondary | ICD-10-CM | POA: Diagnosis not present

## 2016-10-14 MED ORDER — IOPAMIDOL (ISOVUE-300) INJECTION 61%
75.0000 mL | Freq: Once | INTRAVENOUS | Status: AC | PRN
  Administered 2016-10-14: 75 mL via INTRAVENOUS

## 2017-04-13 ENCOUNTER — Inpatient Hospital Stay: Payer: 59 | Admitting: Oncology

## 2017-04-13 ENCOUNTER — Inpatient Hospital Stay: Payer: 59

## 2017-05-18 ENCOUNTER — Inpatient Hospital Stay: Payer: 59 | Admitting: Oncology

## 2017-05-18 ENCOUNTER — Inpatient Hospital Stay: Payer: 59

## 2017-06-01 ENCOUNTER — Inpatient Hospital Stay: Payer: 59 | Admitting: Oncology

## 2017-06-01 ENCOUNTER — Inpatient Hospital Stay: Payer: 59

## 2018-03-11 IMAGING — MR MR HEAD WO/W CM
10 of 11 series · 39 of 48 positions shown · IV contrast (multihance)
Comparison: MRI brain 12/04/2013.

CLINICAL DATA: Autoimmune disorder. Headache. Dizziness. Balance
issues. Blurred and double vision. Weight loss. Personal history of
cervical cancer.

EXAM:
MRI HEAD WITHOUT AND WITH CONTRAST
TECHNIQUE: Multiplanar, multiecho pulse sequences of the brain and surrounding
structures were obtained without and with intravenous contrast.
CONTRAST:  10mL MULTIHANCE GADOBENATE DIMEGLUMINE 529 MG/ML IV SOLN

[Series 2: T1 · sagittal · 5.0mm · 0.45mm/px · 2 of 23 slices shown]
[im 1/23]
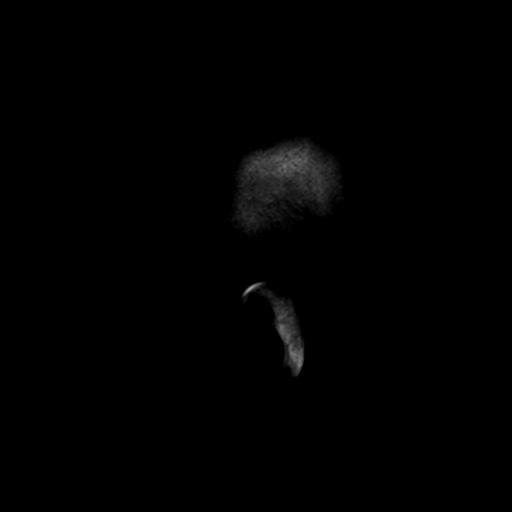
[im 8/23]
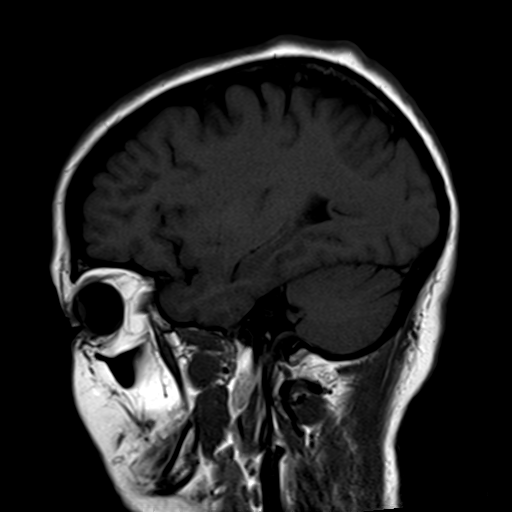

[Series 4: DWI · axial · 3.0mm · 1.20mm/px · z∈[-73,+90]mm · 6 of 56 slices shown (1 of 2)]
[im 1/56]
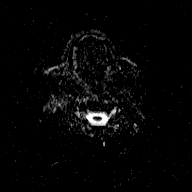
[im 12/56]
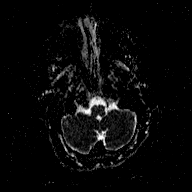
[im 23/56]
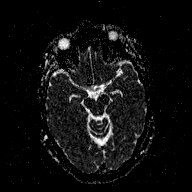
[im 34/56]
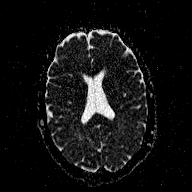
[im 45/56]
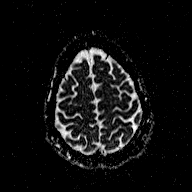
[im 56/56]
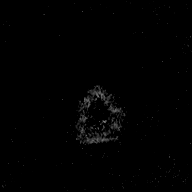

[Series 5: T2 · axial · 5.0mm · 0.72mm/px · z∈[-77,+90]mm · 3 of 27 slices shown (1 of 2)]
[im 1/27]
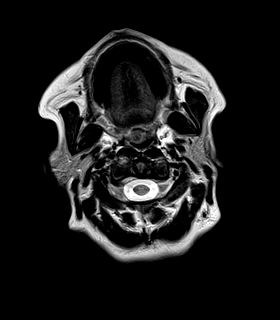
[im 14/27]
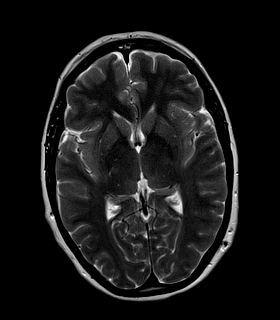
[im 27/27]
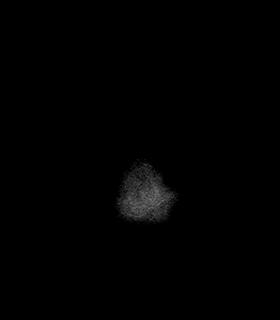

[Series 6: FLAIR · axial · 5.0mm · 0.45mm/px · z∈[-75,+92]mm · 3 of 27 slices shown]
[im 1/27]
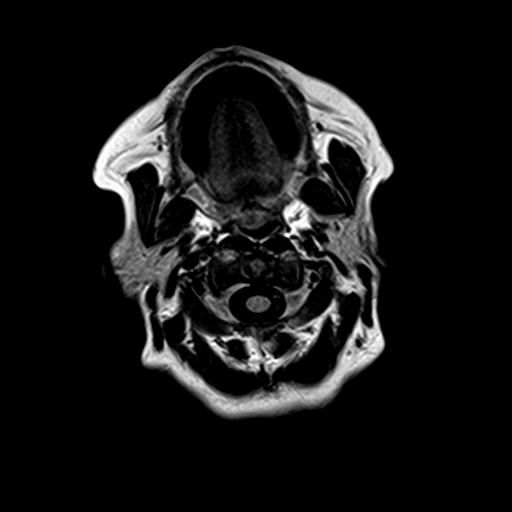
[im 14/27]
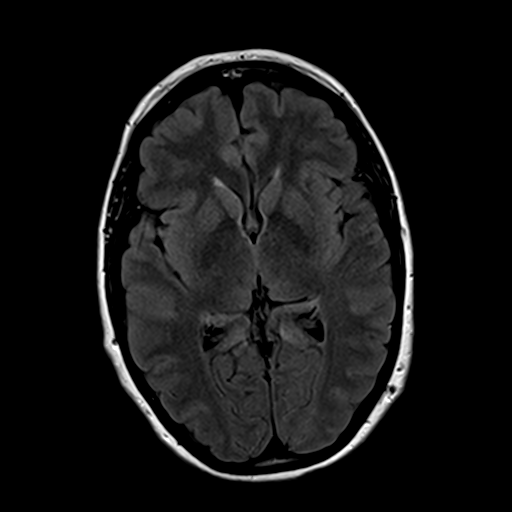
[im 27/27]
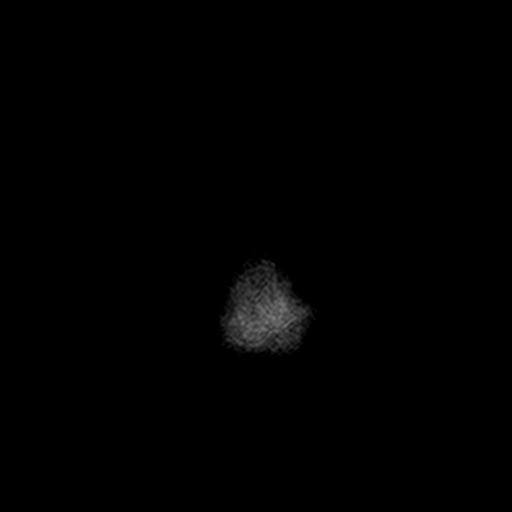

[Series 7: T2 · axial · 5.0mm · 0.72mm/px · z∈[-77,+90]mm · 3 of 27 slices shown (2 of 2)]
[im 1/27]
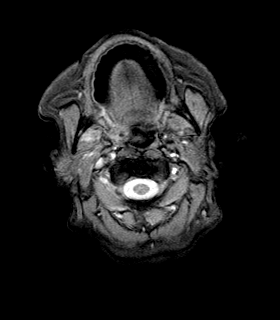
[im 14/27]
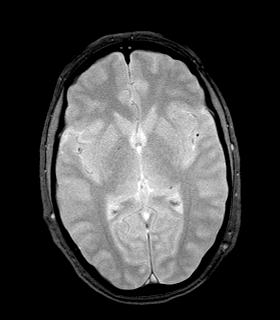
[im 27/27]
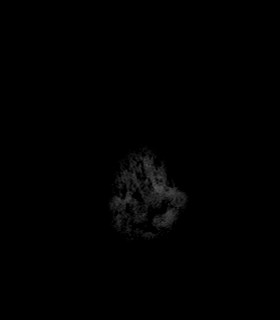

[Series 9: T2 post-contrast · coronal · 5.0mm · 0.45mm/px · 3 of 30 slices shown]
[im 1/30]
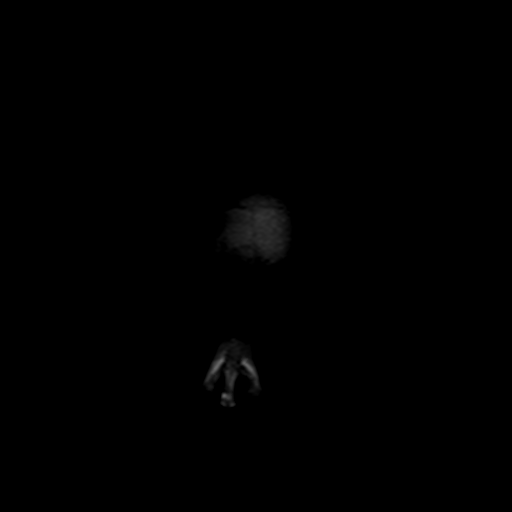
[im 15/30]
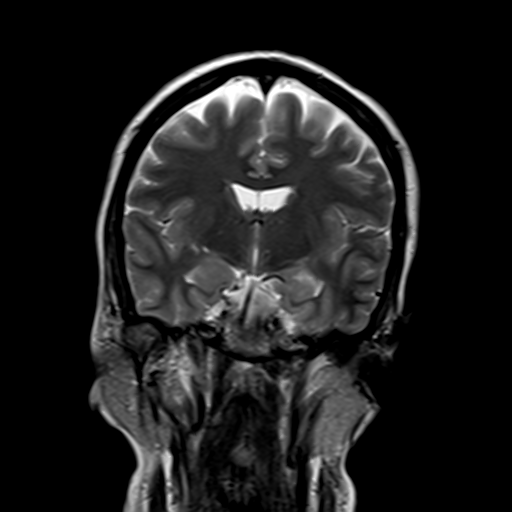
[im 30/30]
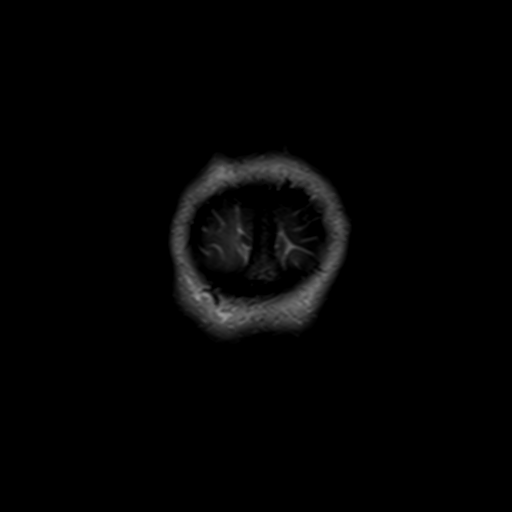

[Series 10: T1 post-contrast · axial · 3.0mm · 1.00mm/px · z∈[-90,+97]mm · 7 of 64 slices shown (1 of 3)]
[im 1/64]
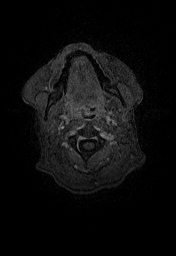
[im 11/64]
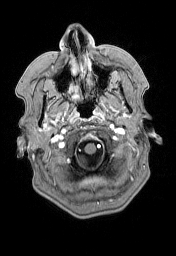
[im 22/64]
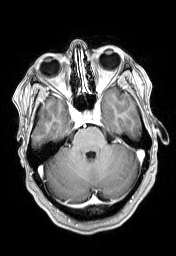
[im 32/64]
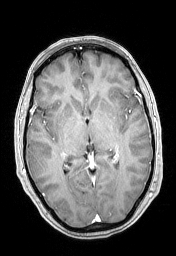
[im 43/64]
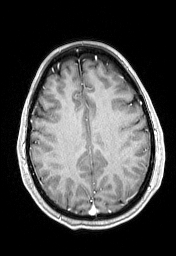
[im 53/64]
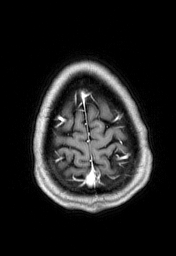
[im 64/64]
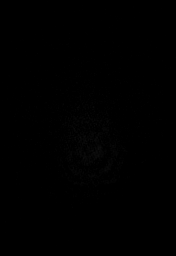

[Series 11: T1 post-contrast · coronal · 5.0mm · 0.45mm/px · 3 of 29 slices shown (2 of 3)]
[im 1/29]
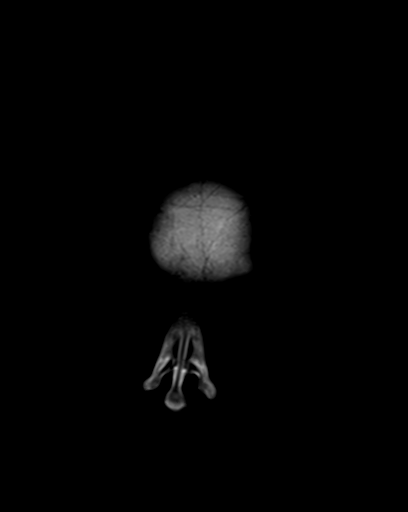
[im 15/29]
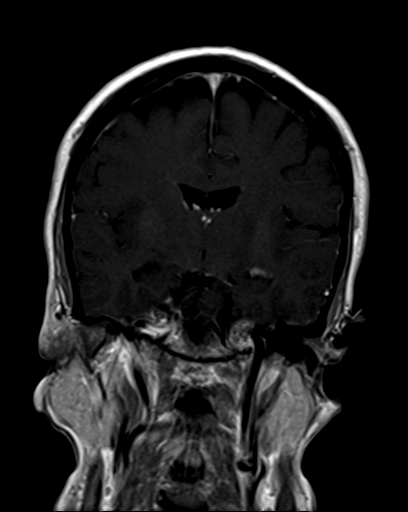
[im 29/29]
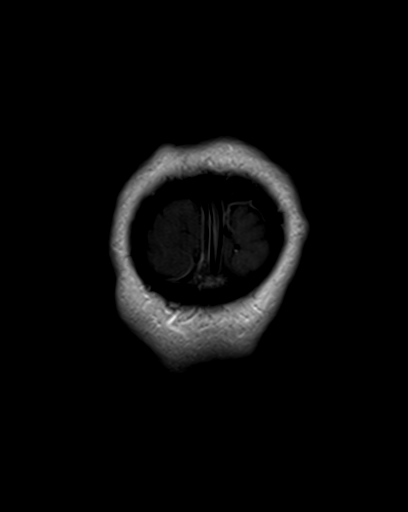

[Series 12: T1 post-contrast · sagittal · 5.0mm · 0.45mm/px · 3 of 23 slices shown (3 of 3)]
[im 1/23]
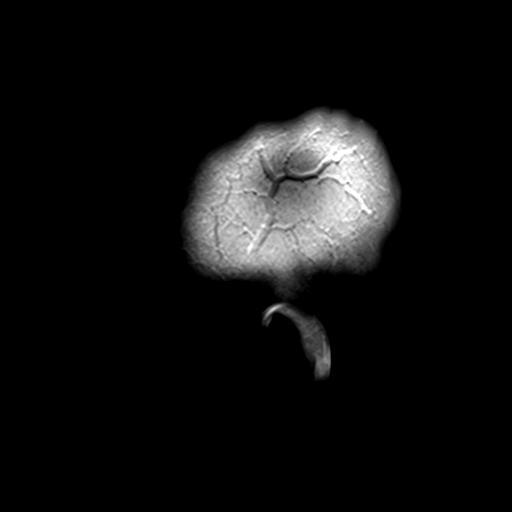
[im 12/23]
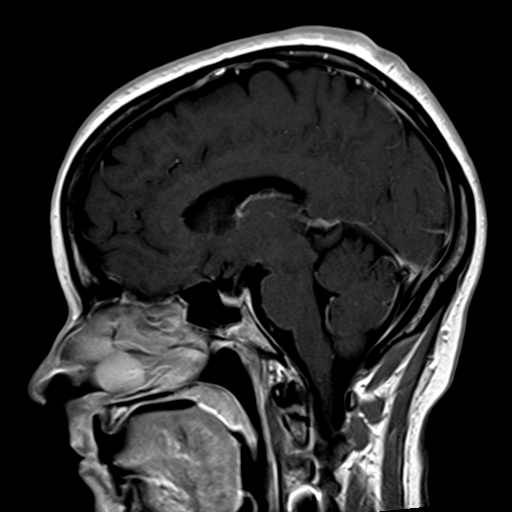
[im 23/23]
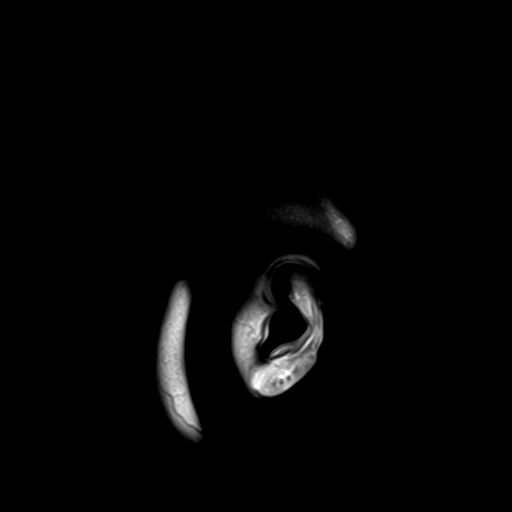

[Series 100: DWI · axial · 3.0mm · 1.20mm/px · z∈[-73,+90]mm · 6 of 56 slices shown (2 of 2)]
[im 1/56]
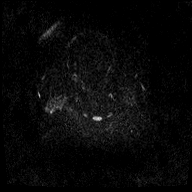
[im 12/56]
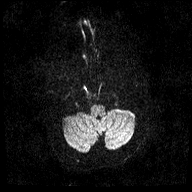
[im 23/56]
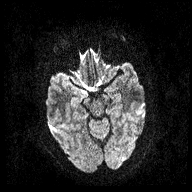
[im 34/56]
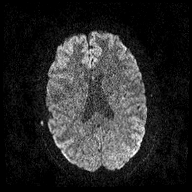
[im 45/56]
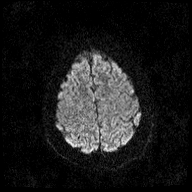
[im 56/56]
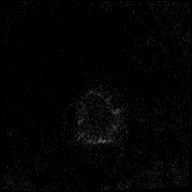

[39 of 48 positions shown; findings below may reference images not displayed]

FINDINGS: Brain: No acute infarct, hemorrhage, or mass lesion is present. The
ventricles are of normal size. No significant extraaxial fluid
collection is present.

Vascular: Flow is present in the major intracranial arteries.

Skull and upper cervical spine: Skullbase is within normal limits.
Midline sagittal structures are unremarkable. Cervical spine fusion
is noted at the C3 level. Craniocervical junction is within normal
limits.

Sinuses/Orbits: The paranasal sinuses and mastoid air cells are
clear. The globes and orbits are within normal limits.
IMPRESSION: Negative MRI of the brain.

## 2018-06-18 ENCOUNTER — Ambulatory Visit
Admission: RE | Admit: 2018-06-18 | Discharge: 2018-06-18 | Disposition: A | Discharge status: Home / Self Care | Payer: Self-pay | Source: Ambulatory Visit | Attending: Oncology | Admitting: Oncology

## 2018-06-18 ENCOUNTER — Encounter: Payer: Self-pay | Admitting: *Deleted

## 2018-06-18 ENCOUNTER — Ambulatory Visit: Payer: 59

## 2018-06-18 ENCOUNTER — Ambulatory Visit: Payer: Self-pay | Attending: Oncology | Admitting: *Deleted

## 2018-06-18 VITALS — BP 161/112 | Temp 98.4°F | Ht 62.0 in | Wt 133.0 lb

## 2018-06-18 DIAGNOSIS — N63 Unspecified lump in unspecified breast: Secondary | ICD-10-CM

## 2018-06-18 NOTE — Progress Notes (Signed)
  Subjective:     Patient ID: Julious Okaracy Uzelac, female   DOB: [birth year], 47 y.o.   MRN: [id number]  HPI   Review of Systems     Objective:   Physical Exam  Pulmonary/Chest: Right breast exhibits mass. Right breast exhibits no inverted nipple, no nipple discharge, no skin change and no tenderness. Left breast exhibits mass. Left breast exhibits no inverted nipple, no nipple discharge, no skin change and no tenderness. Breasts are symmetrical.         Assessment:     47 [facility] White [facility] breast mass found on self exam.  Patient states she found the mass last Wednesday by accident.  Family history of breast cancer includes a maternal first cousin at age 45.  On clinical breast exam I can palpate an approximate 3 cm elongated irregular, mobile mass.  This is the area of the patients palpable finding.  I can also palpate an approximate 1 cm smooth, mobile mass at 3:00 retroareola left breast.  Taught self breast awareness.  Blood pressure elevated at 161/112.  She is very anxious and tearful.  She is to recheck her blood pressure at [facility] or [facility], and if remains higher than 140/90 she is to follow-up with a primary care provider.  Info given to find a primary care at [facility].  Patient states she used to take medication for hypertension.  Reviewed side effects of uncontrolled hypertension.  Hand out on hypertention given to patient.  Patient has been screened for eligibility.  She does not have any insurance, Medicare or Medicaid.  She also meets financial eligibility.  Hand-out given on the Affordable Care Act. Risk Assessment    Risk Scores      06/18/2018   Last edited by: Jim LikeLambert, Sheena M, RN   5-year risk: 0.6 %   Lifetime risk: 7 %            Plan:     Diagnostic mammogram and ultrasound ordered for bilateral breast masses.   Will follow-up per [facility] protocol.

## 2018-06-18 NOTE — Patient Instructions (Signed)

## 2018-06-19 ENCOUNTER — Encounter: Payer: Self-pay | Admitting: *Deleted

## 2018-06-19 NOTE — Progress Notes (Signed)
Left patient a message.  I wanted to follow-up on her blood pressure, and review her mammogram showing bilateral cysts.  Letter mailed from [facility] to inform patient of her normal mammogram results.  Patient is to follow-up with annual screening in one year.  [facility].

## 2018-06-20 ENCOUNTER — Other Ambulatory Visit: Payer: Self-pay | Admitting: *Deleted

## 2018-06-20 DIAGNOSIS — N63 Unspecified lump in unspecified breast: Secondary | ICD-10-CM

## 2018-06-21 ENCOUNTER — Telehealth: Payer: Self-pay | Admitting: *Deleted

## 2018-06-21 NOTE — Progress Notes (Signed)
Patient phoned reporting pain with right breast cyst.  Cyst to be aspirated 06/29/18.  No relief with Tylenol or Ibuprofen.  States feels better with bra on than without.  Recommended good support bra, and Urgent Care if releif not obtained.  Patient does not have primary physician.

## 2018-06-21 NOTE — Telephone Encounter (Signed)
Patient called reporting that she is having a procedure next Friday and she needs advise or prescription re Norgo. Please return her call [phone] ext [phone]

## 2018-06-29 ENCOUNTER — Ambulatory Visit
Admission: RE | Admit: 2018-06-29 | Discharge: 2018-06-29 | Disposition: A | Discharge status: Home / Self Care | Payer: Self-pay | Source: Ambulatory Visit | Attending: Oncology | Admitting: Oncology

## 2018-06-29 DIAGNOSIS — N63 Unspecified lump in unspecified breast: Secondary | ICD-10-CM | POA: Insufficient documentation

## 2021-06-14 ENCOUNTER — Other Ambulatory Visit: Payer: Self-pay | Admitting: Family

## 2021-06-14 DIAGNOSIS — N6320 Unspecified lump in the left breast, unspecified quadrant: Secondary | ICD-10-CM

## 2022-09-26 ENCOUNTER — Other Ambulatory Visit: Payer: Self-pay | Admitting: Primary Care

## 2022-09-26 DIAGNOSIS — Z1231 Encounter for screening mammogram for malignant neoplasm of breast: Secondary | ICD-10-CM
